# Patient Record
Sex: Female | Born: 1985
Health system: Southern US, Community
[De-identification: ages and names within clinical notes are randomized; demographics above are authoritative.]

## PROBLEM LIST (undated history)

## (undated) DIAGNOSIS — T7840XA Allergy, unspecified, initial encounter: Secondary | ICD-10-CM

## (undated) DIAGNOSIS — Z8669 Personal history of other diseases of the nervous system and sense organs: Secondary | ICD-10-CM

## (undated) DIAGNOSIS — R87629 Unspecified abnormal cytological findings in specimens from vagina: Secondary | ICD-10-CM

## (undated) DIAGNOSIS — E079 Disorder of thyroid, unspecified: Secondary | ICD-10-CM

## (undated) DIAGNOSIS — R896 Abnormal cytological findings in specimens from other organs, systems and tissues: Secondary | ICD-10-CM

## (undated) HISTORY — DX: Allergy, unspecified, initial encounter: T78.40XA

## (undated) HISTORY — DX: Personal history of other diseases of the nervous system and sense organs: Z86.69

## (undated) HISTORY — DX: Disorder of thyroid, unspecified: E07.9

## (undated) HISTORY — PX: WISDOM TOOTH EXTRACTION: SHX21

## (undated) HISTORY — DX: Abnormal cytological findings in specimens from other organs, systems and tissues: R89.6

---

## 2011-10-25 HISTORY — PX: EYE SURGERY: SHX253

## 2011-12-23 DIAGNOSIS — IMO0001 Reserved for inherently not codable concepts without codable children: Secondary | ICD-10-CM

## 2011-12-23 HISTORY — DX: Reserved for inherently not codable concepts without codable children: IMO0001

## 2011-12-29 ENCOUNTER — Encounter (INDEPENDENT_AMBULATORY_CARE_PROVIDER_SITE_OTHER): Payer: BC Managed Care – PPO | Admitting: Registered Nurse

## 2011-12-29 DIAGNOSIS — Z01419 Encounter for gynecological examination (general) (routine) without abnormal findings: Secondary | ICD-10-CM

## 2013-01-01 ENCOUNTER — Ambulatory Visit: Payer: BC Managed Care – PPO | Admitting: Obstetrics and Gynecology

## 2013-01-01 ENCOUNTER — Encounter: Payer: Self-pay | Admitting: Obstetrics and Gynecology

## 2013-01-01 VITALS — BP 118/72 | Temp 98.4°F | Ht 65.0 in | Wt 151.0 lb

## 2013-01-01 DIAGNOSIS — Z124 Encounter for screening for malignant neoplasm of cervix: Secondary | ICD-10-CM

## 2013-01-01 DIAGNOSIS — Z01419 Encounter for gynecological examination (general) (routine) without abnormal findings: Secondary | ICD-10-CM

## 2013-01-01 MED ORDER — NORETHIN ACE-ETH ESTRAD-FE 1-20 MG-MCG PO TABS: 1.0000 | ORAL_TABLET | Freq: Every day | ORAL | Status: DC

## 2013-01-01 NOTE — Progress Notes (Signed)
Subjective:    Heather Richards is a 27 y.o. female, No obstetric history on file., who presents for an annual exam. The patient reports no complaints.  Menstrual cycle:   LMP: Patient's last menstrual period was 12/18/2012.             Review of Systems Pertinent items are noted in HPI. Denies pelvic pain, urinary tract symptoms, vaginitis symptoms, irregular bleeding, menopausal symptoms, change in bowel habits or rectal bleeding   Objective:    BP 118/72  Temp(Src) 98.4 F (36.9 C) (Oral)  Ht 5\' 5"  (1.651 m)  Wt 151 lb (68.493 kg)  BMI 25.13 kg/m2  LMP 12/18/2012   Wt Readings from Last 1 Encounters:  01/01/13 151 lb (68.493 kg)   Body mass index is 25.13 kg/(m^2). General Appearance: Alert, no acute distress HEENT: Grossly normal Neck / Thyroid: Supple, no thyromegaly or cervical adenopathy Lungs: Clear to auscultation bilaterally Back: No CVA tenderness Breast Exam: No masses or nodes.No dimpling, nipple retraction or discharge. Cardiovascular: Regular rate and rhythm.  Gastrointestinal: Soft, non-tender, no masses or organomegaly Pelvic Exam: EGBUS-wnl, vagina-normal rugae, cervix- without lesions or tenderness, uterus appears normal size shape and consistency, adnexae-no masses or tenderness Lymphatic Exam: Non-palpable nodes in neck, clavicular,  axillary, or inguinal regions  Skin: no rashes or abnormalities Extremities: no clubbing cyanosis or edema  Neurologic: grossly normal Psychiatric: Alert and oriented  Assessment:   Routine GYN Exam   Plan:  Junel 1/20  #3  1 po qd  4refills  PAP sent  RTO 1 year or prn  Lynden Flemmer,ELMIRAPA-C

## 2013-01-01 NOTE — Progress Notes (Signed)
Regular Periods: yes Mammogram: no  Monthly Breast Ex.: yes Exercise: no  Tetanus < 10 years: yes Seatbelts: yes  NI. Bladder Functn.: yes Abuse at home: no  Daily BM's: yes Stressful Work: yes  Healthy Diet: yes Sigmoid-Colonoscopy: no  Calcium: no Medical problems this year: none   LAST PAP:3/13  Ascus hpv not detected  Contraception: junel fe1/20  Mammogram:  no  PCP: [facility]  PMH: no change  FMH: no change  Last Bone Scan: no  Pt is married.

## 2013-01-02 LAB — PAP IG W/ RFLX HPV ASCU

## 2013-04-17 ENCOUNTER — Ambulatory Visit (INDEPENDENT_AMBULATORY_CARE_PROVIDER_SITE_OTHER): Payer: BC Managed Care – PPO | Admitting: Emergency Medicine

## 2013-04-17 VITALS — BP 130/85 | HR 85 | Temp 98.3°F | Resp 16 | Ht 64.75 in | Wt 152.4 lb

## 2013-04-17 DIAGNOSIS — N3000 Acute cystitis without hematuria: Secondary | ICD-10-CM

## 2013-04-17 DIAGNOSIS — R109 Unspecified abdominal pain: Secondary | ICD-10-CM

## 2013-04-17 LAB — POCT URINALYSIS DIPSTICK
Bilirubin, UA: NEGATIVE
Blood, UA: NEGATIVE
Glucose, UA: NEGATIVE
Nitrite, UA: NEGATIVE
Urobilinogen, UA: 0.2

## 2013-04-17 LAB — POCT UA - MICROSCOPIC ONLY: Yeast, UA: NEGATIVE

## 2013-04-17 MED ORDER — SULFAMETHOXAZOLE-TRIMETHOPRIM 800-160 MG PO TABS: 1.0000 | ORAL_TABLET | Freq: Two times a day (BID) | ORAL | Status: DC

## 2013-04-17 MED ORDER — HYDROCODONE-ACETAMINOPHEN 5-325 MG PO TABS: 1.0000 | ORAL_TABLET | ORAL | Status: DC | PRN

## 2013-04-17 NOTE — Progress Notes (Signed)
[facility] [address], [place] [address] [phone]  Date:  04/17/2013   Name:  Heather Richards   DOB:  11-19-1985   MRN:  [id number]  PCP:  Purcell Nails, MD    Chief Complaint: Flank Pain   History of Present Illness:  Heather Richards is a 27 y.o. very pleasant female patient who presents with the following:  Ill since Monday with left abdominal pain.  Worse last night.  Awakened her from a sleep with pain.  No dysuria, urgency or frequency.  No nausea or vomiting.  No stool change, vaginal discharge or bleeding.  Pain is colicy in nature and radiates into the left abdomen.  No improvement with over the counter medications or other home remedies. Denies other complaint or health concern today.   There are no active problems to display for this patient.   Past Medical History  Diagnosis Date  . Hx of migraines   . ASCUS (atypical squamous cells of undetermined significance) on Pap smear 3/13    hpv not detected  . Allergy     Past Surgical History  Procedure Laterality Date  . Wisdom tooth extraction      History  Substance Use Topics  . Smoking status: Never Smoker   . Smokeless tobacco: Never Used  . Alcohol Use: Yes     Comment: occasional    Family History  Problem Relation Age of Onset  . Cancer Maternal Aunt     colon  . Cancer Maternal Uncle     brain  . Cancer Paternal Uncle     lung  . Heart disease Maternal Grandfather     No Known Allergies  Medication list has been reviewed and updated.  Current Outpatient Prescriptions on File Prior to Visit  Medication Sig Dispense Refill  . norethindrone-ethinyl estradiol (JUNEL FE 1/20) 1-20 MG-MCG tablet Take 1 tablet by mouth daily.  3 Package  4  . norethindrone-ethinyl estradiol (JUNEL FE,GILDESS FE,LOESTRIN FE) 1-20 MG-MCG tablet Take 1 tablet by mouth daily.       No current facility-administered medications on file prior to visit.    Review of Systems:  As per HPI,  otherwise negative.    Physical Examination: Filed Vitals:   04/17/13 1944  BP: 130/85  Pulse: 85  Temp: 98.3 F (36.8 C)  Resp: 16   Filed Vitals:   04/17/13 1944  Height: 5' 4.75" (1.645 m)  Weight: 152 lb 6.4 oz (69.128 kg)   Body mass index is 25.55 kg/(m^2). Ideal Body Weight: Weight in (lb) to have BMI = 25: 148.8  GEN: WDWN, NAD, Non-toxic, A & O x 3 HEENT: Atraumatic, Normocephalic. Neck supple. No masses, No LAD. Ears and Nose: No external deformity. CV: RRR, No M/G/R. No JVD. No thrill. No extra heart sounds. PULM: CTA B, no wheezes, crackles, rhonchi. No retractions. No resp. distress. No accessory muscle use. ABD: S, NT, ND, +BS. No rebound. No HSM. EXTR: No c/c/e NEURO Normal gait.  PSYCH: Normally interactive. Conversant. Not depressed or anxious appearing.  Calm demeanor.    Assessment and Plan: Cystitis Septra vicodin   Signed,  Phillips Odor, MD   Results for orders placed in visit on 04/17/13  POCT UA - MICROSCOPIC ONLY      Result Value Range   WBC, Ur, HPF, POC 1-5 with small clusters     RBC, urine, microscopic 7-12     Bacteria, U Microscopic 1+  Mucus, UA small     Epithelial cells, urine per micros 1-4     Crystals, Ur, HPF, POC neg     Casts, Ur, LPF, POC neg     Yeast, UA neg    POCT URINALYSIS DIPSTICK      Result Value Range   Color, UA yellow     Clarity, UA hazy     Glucose, UA neg     Bilirubin, UA neg     Ketones, UA neg     Spec Grav, UA 1.010     Blood, UA neg     pH, UA 6.0     Protein, UA neg     Urobilinogen, UA 0.2     Nitrite, UA neg     Leukocytes, UA moderate (2+)

## 2013-04-17 NOTE — Patient Instructions (Addendum)

## 2014-01-09 ENCOUNTER — Ambulatory Visit (INDEPENDENT_AMBULATORY_CARE_PROVIDER_SITE_OTHER): Payer: BC Managed Care – PPO | Admitting: Family Medicine

## 2014-01-09 ENCOUNTER — Ambulatory Visit: Payer: BC Managed Care – PPO

## 2014-01-09 VITALS — BP 121/76 | HR 80 | Temp 98.3°F | Resp 16 | Ht 65.25 in | Wt 147.8 lb

## 2014-01-09 DIAGNOSIS — N390 Urinary tract infection, site not specified: Secondary | ICD-10-CM

## 2014-01-09 DIAGNOSIS — R110 Nausea: Secondary | ICD-10-CM

## 2014-01-09 DIAGNOSIS — R109 Unspecified abdominal pain: Secondary | ICD-10-CM

## 2014-01-09 LAB — POCT CBC
Granulocyte percent: 56.3 % (ref 37–80)
HCT, POC: 37.4 % — AB (ref 37.7–47.9)
Hemoglobin: 11.8 g/dL — AB (ref 12.2–16.2)
Lymph, poc: 3.2 (ref 0.6–3.4)
MCH, POC: 29 pg (ref 27–31.2)
MCHC: 31.6 g/dL — AB (ref 31.8–35.4)
MCV: 91.8 fL (ref 80–97)
MID (cbc): 0.8 (ref 0–0.9)
MPV: 10 fL (ref 0–99.8)
POC Granulocyte: 5.1 (ref 2–6.9)
POC LYMPH PERCENT: 35.2 % (ref 10–50)
POC MID %: 8.5 % (ref 0–12)
Platelet Count, POC: 308 10*3/uL (ref 142–424)
RBC: 4.07 M/uL (ref 4.04–5.48)
RDW, POC: 13.2 %
WBC: 9 10*3/uL (ref 4.6–10.2)

## 2014-01-09 LAB — POCT UA - MICROSCOPIC ONLY
Casts, Ur, LPF, POC: NEGATIVE
Crystals, Ur, HPF, POC: NEGATIVE
Mucus, UA: NEGATIVE
Yeast, UA: NEGATIVE

## 2014-01-09 LAB — POCT URINALYSIS DIPSTICK
Bilirubin, UA: NEGATIVE
Blood, UA: NEGATIVE
Glucose, UA: NEGATIVE
Ketones, UA: NEGATIVE
Nitrite, UA: NEGATIVE
Protein, UA: NEGATIVE
Spec Grav, UA: 1.01
Urobilinogen, UA: 0.2
pH, UA: 7

## 2014-01-09 MED ORDER — SULFAMETHOXAZOLE-TMP DS 800-160 MG PO TABS: 1.0000 | ORAL_TABLET | Freq: Two times a day (BID) | ORAL | Status: DC

## 2014-01-09 NOTE — Progress Notes (Signed)
Chief Complaint:  Chief Complaint  Patient presents with  . Abdominal Pain    2 days, bloating, gas,   . Nausea    HPI: Heather Richards is a 28 y.o. female who is here for 2 day history of bloating and abd cramps after eating pinto beans, she has had loose stools, last BM  Was yesterday. She has had no fevers or chills, however has had 3/10 pain dull on left upper quadrant, bloating and nausea. NO emesis. Nonbloody loose stools. Has not tried anythign for this. Wants to make sure nothing infectious. She has not tried any new meds, no new travesl, they have city water, she has not eaten anything new except beans that her and husband cooked. He does not have any sxs.  She normally does not digest beans well. Prior history of cystitis, treated with bactrim.  Past Medical History  Diagnosis Date  . Hx of migraines   . ASCUS (atypical squamous cells of undetermined significance) on Pap smear 3/13    hpv not detected  . Allergy    Past Surgical History  Procedure Laterality Date  . Wisdom tooth extraction     History   Social History  . Marital Status: Married    Spouse Name: N/A    Number of Children: N/A  . Years of Education: N/A   Social History Main Topics  . Smoking status: Never Smoker   . Smokeless tobacco: Never Used  . Alcohol Use: Yes     Comment: occasional  . Drug Use: No  . Sexual Activity: Yes    Birth Control/ Protection: Pill     Comment: junel1/20   Other Topics Concern  . None   Social History Narrative  . None   Family History  Problem Relation Age of Onset  . Cancer Maternal Aunt     colon  . Cancer Maternal Uncle     brain  . Cancer Paternal Uncle     lung  . Heart disease Maternal Grandfather    No Known Allergies Prior to Admission medications   Medication Sig Start Date End Date Taking? Authorizing Provider  norethindrone-ethinyl estradiol (JUNEL FE,GILDESS FE,LOESTRIN FE) 1-20 MG-MCG tablet Take 1 tablet by mouth daily.   Yes  Historical Provider, MD  Difluprednate (DUREZOL) 0.05 % EMUL Apply to eye.    Historical Provider, MD  gatifloxacin (ZYMAR) 0.3 % ophthalmic drops 1 drop 4 (four) times daily.    Historical Provider, MD  HYDROcodone-acetaminophen (NORCO) 5-325 MG per tablet Take 1-2 tablets by mouth every 4 (four) hours as needed for pain. 04/17/13   Phillips Odor, MD  norethindrone-ethinyl estradiol (JUNEL FE 1/20) 1-20 MG-MCG tablet Take 1 tablet by mouth daily. 01/01/13   Henreitta Leber, PA-C  sulfamethoxazole-trimethoprim (BACTRIM DS,SEPTRA DS) 800-160 MG per tablet Take 1 tablet by mouth 2 (two) times daily. 04/17/13   Phillips Odor, MD     ROS: The patient denies fevers, chills, night sweats, unintentional weight loss, chest pain, palpitations, wheezing, dyspnea on exertion,, vomiting, dysuria, hematuria, melena, numbness, weakness, or tingling.   All other systems have been reviewed and were otherwise negative with the exception of those mentioned in the HPI and as above.    PHYSICAL EXAM: Filed Vitals:   01/09/14 1748  BP: 121/76  Pulse: 80  Temp: 98.3 F (36.8 C)  Resp: 16   Filed Vitals:   01/09/14 1748  Height: 5' 5.25" (1.657 m)  Weight: 147 lb 12.8 oz (67.042 kg)  Body mass index is 24.42 kg/(m^2).  General: Alert, no acute distress HEENT:  Normocephalic, atraumatic, oropharynx patent. EOMI, PERRLA Cardiovascular:  Regular rate and rhythm, no rubs murmurs or gallops.  No Carotid bruits, radial pulse intact. No pedal edema.  Respiratory: Clear to auscultation bilaterally.  No wheezes, rales, or rhonchi.  No cyanosis, no use of accessory musculature GI: No organomegaly, abdomen is soft and non-tender, positive bowel sounds.  No masses. Skin: No rashes. Neurologic: Facial musculature symmetric. Psychiatric: Patient is appropriate throughout our interaction. Lymphatic: No cervical lymphadenopathy Musculoskeletal: Gait intact. No CVA tenderness   LABS: Results for orders placed  in visit on 01/09/14  POCT CBC      Result Value Ref Range   WBC 9.0  4.6 - 10.2 K/uL   Lymph, poc 3.2  0.6 - 3.4   POC LYMPH PERCENT 35.2  10 - 50 %L   MID (cbc) 0.8  0 - 0.9   POC MID % 8.5  0 - 12 %M   POC Granulocyte 5.1  2 - 6.9   Granulocyte percent 56.3  37 - 80 %G   RBC 4.07  4.04 - 5.48 M/uL   Hemoglobin 11.8 (*) 12.2 - 16.2 g/dL   HCT, POC 06.337.4 (*) 01.637.7 - 47.9 %   MCV 91.8  80 - 97 fL   MCH, POC 29.0  27 - 31.2 pg   MCHC 31.6 (*) 31.8 - 35.4 g/dL   RDW, POC 01.013.2     Platelet Count, POC 308  142 - 424 K/uL   MPV 10.0  0 - 99.8 fL  POCT UA - MICROSCOPIC ONLY      Result Value Ref Range   WBC, Ur, HPF, POC 3-5     RBC, urine, microscopic 0-2     Bacteria, U Microscopic trace     Mucus, UA neg     Epithelial cells, urine per micros 1-3     Crystals, Ur, HPF, POC neg     Casts, Ur, LPF, POC neg     Yeast, UA neg    POCT URINALYSIS DIPSTICK      Result Value Ref Range   Color, UA yellow     Clarity, UA clear     Glucose, UA neg     Bilirubin, UA neg     Ketones, UA neg     Spec Grav, UA 1.010     Blood, UA neg     pH, UA 7.0     Protein, UA neg     Urobilinogen, UA 0.2     Nitrite, UA neg     Leukocytes, UA small (1+)       EKG/XRAY:   [facility] Chest is normal Abd is normal   ASSESSMENT/PLAN: Encounter Diagnoses  Name Primary?  . Abdominal pain, other specified site Yes  . Nausea alone   . UTI (urinary tract infection)    Rx Bactrim DS BID x 7 days for presumptive treatment possibel UTI I think she may just have some food intolerance to beans and sxs are stemming from this.However sicne she has had UTIs/cystitis in the past will go ahead and treat.  Push fluids Urine cx pending F/u prn   Gross sideeffects, risk and benefits, and alternatives of medications d/w patient. Patient is aware that all medications have potential sideeffects and we are unable to predict every sideeffect or drug-drug interaction that may  occur.  Hamilton CapriLE, Joslin Doell PHUONG, DO 01/09/2014 7:18  PM

## 2014-01-09 NOTE — Patient Instructions (Signed)
Urinary Tract Infection  Urinary tract infections (UTIs) can develop anywhere along your urinary tract. Your urinary tract is your body's drainage system for removing wastes and extra water. Your urinary tract includes two kidneys, two ureters, a bladder, and a urethra. Your kidneys are a pair of bean-shaped organs. Each kidney is about the size of your fist. They are located below your ribs, one on each side of your spine.  CAUSES  Infections are caused by microbes, which are microscopic organisms, including fungi, viruses, and bacteria. These organisms are so small that they can only be seen through a microscope. Bacteria are the microbes that most commonly cause UTIs.  SYMPTOMS   Symptoms of UTIs may vary by age and gender of the patient and by the location of the infection. Symptoms in young women typically include a frequent and intense urge to urinate and a painful, burning feeling in the bladder or urethra during urination. Older women and men are more likely to be tired, shaky, and weak and have muscle aches and abdominal pain. A fever may mean the infection is in your kidneys. Other symptoms of a kidney infection include pain in your back or sides below the ribs, nausea, and vomiting.  DIAGNOSIS  To diagnose a UTI, your caregiver will ask you about your symptoms. Your caregiver also will ask to provide a urine sample. The urine sample will be tested for bacteria and white blood cells. White blood cells are made by your body to help fight infection.  TREATMENT   Typically, UTIs can be treated with medication. Because most UTIs are caused by a bacterial infection, they usually can be treated with the use of antibiotics. The choice of antibiotic and length of treatment depend on your symptoms and the type of bacteria causing your infection.  HOME CARE INSTRUCTIONS   If you were prescribed antibiotics, take them exactly as your caregiver instructs you. Finish the medication even if you feel better after you  have only taken some of the medication.   Drink enough water and fluids to keep your urine clear or pale yellow.   Avoid caffeine, tea, and carbonated beverages. They tend to irritate your bladder.   Empty your bladder often. Avoid holding urine for long periods of time.   Empty your bladder before and after sexual intercourse.   After a bowel movement, women should cleanse from front to back. Use each tissue only once.  SEEK MEDICAL CARE IF:    You have back pain.   You develop a fever.   Your symptoms do not begin to resolve within 3 days.  SEEK IMMEDIATE MEDICAL CARE IF:    You have severe back pain or lower abdominal pain.   You develop chills.   You have nausea or vomiting.   You have continued burning or discomfort with urination.  MAKE SURE YOU:    Understand these instructions.   Will watch your condition.   Will get help right away if you are not doing well or get worse.  Document Released: 07/20/2005 Document Revised: 04/10/2012 Document Reviewed: 11/18/2011  Exit[employer] Patient Information 2014 [employer].

## 2014-01-10 LAB — COMPREHENSIVE METABOLIC PANEL WITH GFR
ALT: 18 U/L (ref 0–35)
AST: 15 U/L (ref 0–37)
Calcium: 9.2 mg/dL (ref 8.4–10.5)
Chloride: 104 meq/L (ref 96–112)
Creat: 0.71 mg/dL (ref 0.50–1.10)
Potassium: 4.6 meq/L (ref 3.5–5.3)
Sodium: 139 meq/L (ref 135–145)

## 2014-01-10 LAB — COMPREHENSIVE METABOLIC PANEL
Albumin: 4.5 g/dL (ref 3.5–5.2)
Alkaline Phosphatase: 54 U/L (ref 39–117)
BUN: 8 mg/dL (ref 6–23)
CO2: 27 mEq/L (ref 19–32)
Glucose, Bld: 83 mg/dL (ref 70–99)
Total Bilirubin: 0.3 mg/dL (ref 0.2–1.2)
Total Protein: 6.8 g/dL (ref 6.0–8.3)

## 2014-01-11 LAB — URINE CULTURE: Colony Count: 45000

## 2014-01-15 ENCOUNTER — Telehealth: Payer: Self-pay | Admitting: Family Medicine

## 2014-01-15 NOTE — Telephone Encounter (Signed)
LM to call me back when I get into office. She still has similar issues to prior. May need OV if persistent, last time we only did UA and treated as UTI , if she still has pain then will ned STD testing and pelvic exam.

## 2015-10-25 DIAGNOSIS — D649 Anemia, unspecified: Secondary | ICD-10-CM

## 2015-10-25 DIAGNOSIS — E039 Hypothyroidism, unspecified: Secondary | ICD-10-CM

## 2015-10-25 HISTORY — DX: Hypothyroidism, unspecified: E03.9

## 2015-10-25 HISTORY — DX: Anemia, unspecified: D64.9

## 2015-10-25 NOTE — L&D Delivery Note (Signed)
Vaginal Delivery Note The pt utilized an epidural as pain management.   Spontaneous rupture of membranes yesterday, at 2109, clear.  GBS was positive, pt refused treatment.     Pushing with guidance began at  0730.   After 35 minutes of pushing the head, shoulders and the body of a viable female infant "Marcello Mooressaac" delivered spontaneously with maternal effort in the ROA position at 0805.    Loose Freedom x 1, unable to reduced, infant somersaulted thru without difficulty.   With vigorous tone and spontaneous cry, the infant was placed on moms abd.  After the umbilical cord was clamped it was cut by the FOB, then cord blood was obtained for evaluation. Spontaneous delivery of a intact placenta with a 3 vessel cord via Shultz at  [phone].   Episiotomy: None   The vulva, perineum, vaginal vault, rectum and cervix were inspected and revealed a 2 degree deep vaginal & perineal, repaired using a 3-0 vicryl on a CT needle and a superficial bilateral labial  repaired using a 4-0 vicryl on a SH needle with 40cc of 1% lidocaine.  Lidocaine was not used, the epidural was sufficient for the repair.   The rectum sphincter intact after the repair.   Patient tolerated repair well.   Postpartum pitocin as ordered.  Fundus firm, lochia minimum, bleeding under control.   EBL 100, Pt hemodynamically stable.   Sponge, laps and needle count correct and verified with the primary care nurse.  Attending MD available at all times.    Routine postpartum orders   Mother desires paragaud for contraception  Mom plans to breastfeed   Infant will not becircumcision   Placenta to pathology: NO     Cord Gases sent to lab: NO Cord blood sent to lab: YES   APGARS:  9 at 1 minute and 9 at 5 minutes Weight:. pending     Both mom and baby were left in stable condition, baby skin to skin.      Heather Richards, CNM, MSN 01/21/2016. 12:56 PM

## 2016-01-19 ENCOUNTER — Encounter (HOSPITAL_COMMUNITY): Payer: Self-pay | Admitting: *Deleted

## 2016-01-19 ENCOUNTER — Inpatient Hospital Stay (HOSPITAL_COMMUNITY)
Admission: AD | Admit: 2016-01-19 | Discharge: 2016-01-23 | DRG: 775 | Disposition: A | Discharge status: Home / Self Care | Payer: BLUE CROSS/BLUE SHIELD | Source: Ambulatory Visit | Attending: Obstetrics and Gynecology | Admitting: Obstetrics and Gynecology

## 2016-01-19 DIAGNOSIS — O4202 Full-term premature rupture of membranes, onset of labor within 24 hours of rupture: Principal | ICD-10-CM | POA: Diagnosis present

## 2016-01-19 DIAGNOSIS — O9989 Other specified diseases and conditions complicating pregnancy, childbirth and the puerperium: Secondary | ICD-10-CM

## 2016-01-19 DIAGNOSIS — D62 Acute posthemorrhagic anemia: Secondary | ICD-10-CM | POA: Diagnosis not present

## 2016-01-19 DIAGNOSIS — E669 Obesity, unspecified: Secondary | ICD-10-CM | POA: Diagnosis present

## 2016-01-19 DIAGNOSIS — O26893 Other specified pregnancy related conditions, third trimester: Secondary | ICD-10-CM | POA: Diagnosis present

## 2016-01-19 DIAGNOSIS — Z6791 Unspecified blood type, Rh negative: Secondary | ICD-10-CM | POA: Diagnosis not present

## 2016-01-19 DIAGNOSIS — Z6831 Body mass index (BMI) 31.0-31.9, adult: Secondary | ICD-10-CM | POA: Diagnosis not present

## 2016-01-19 DIAGNOSIS — Z283 Underimmunization status: Secondary | ICD-10-CM

## 2016-01-19 DIAGNOSIS — O99824 Streptococcus B carrier state complicating childbirth: Secondary | ICD-10-CM | POA: Diagnosis present

## 2016-01-19 DIAGNOSIS — O99214 Obesity complicating childbirth: Secondary | ICD-10-CM | POA: Diagnosis present

## 2016-01-19 DIAGNOSIS — Z2839 Other underimmunization status: Secondary | ICD-10-CM

## 2016-01-19 DIAGNOSIS — O9081 Anemia of the puerperium: Secondary | ICD-10-CM | POA: Diagnosis not present

## 2016-01-19 DIAGNOSIS — Z3A41 41 weeks gestation of pregnancy: Secondary | ICD-10-CM | POA: Diagnosis not present

## 2016-01-19 DIAGNOSIS — O26899 Other specified pregnancy related conditions, unspecified trimester: Secondary | ICD-10-CM | POA: Diagnosis present

## 2016-01-19 DIAGNOSIS — B951 Streptococcus, group B, as the cause of diseases classified elsewhere: Secondary | ICD-10-CM | POA: Diagnosis present

## 2016-01-19 DIAGNOSIS — O480 Post-term pregnancy: Secondary | ICD-10-CM | POA: Diagnosis present

## 2016-01-19 DIAGNOSIS — O09899 Supervision of other high risk pregnancies, unspecified trimester: Secondary | ICD-10-CM

## 2016-01-19 HISTORY — DX: Other specified pregnancy related conditions, unspecified trimester: O26.899

## 2016-01-19 HISTORY — DX: Unspecified blood type, rh negative: Z67.91

## 2016-01-19 HISTORY — DX: Unspecified abnormal cytological findings in specimens from vagina: R87.629

## 2016-01-19 HISTORY — DX: Other underimmunization status: Z28.39

## 2016-01-19 LAB — CBC
HEMATOCRIT: 37.4 % (ref 36.0–46.0)
Hemoglobin: 12.8 g/dL (ref 12.0–15.0)
MCH: 29.4 pg (ref 26.0–34.0)
MCHC: 34.2 g/dL (ref 30.0–36.0)
MCV: 85.8 fL (ref 78.0–100.0)
Platelets: 198 10*3/uL (ref 150–400)
RBC: 4.36 MIL/uL (ref 3.87–5.11)
RDW: 13.8 % (ref 11.5–15.5)
WBC: 15.7 10*3/uL — ABNORMAL HIGH (ref 4.0–10.5)

## 2016-01-19 LAB — OB RESULTS CONSOLE RPR: RPR: NONREACTIVE

## 2016-01-19 LAB — OB RESULTS CONSOLE ABO/RH: RH Type: NEGATIVE

## 2016-01-19 LAB — OB RESULTS CONSOLE GC/CHLAMYDIA
CHLAMYDIA, DNA PROBE: NEGATIVE
Gonorrhea: NEGATIVE

## 2016-01-19 LAB — TYPE AND SCREEN
ABO/RH(D): O NEG
ANTIBODY SCREEN: NEGATIVE

## 2016-01-19 LAB — OB RESULTS CONSOLE GBS: GBS: POSITIVE

## 2016-01-19 LAB — OB RESULTS CONSOLE ANTIBODY SCREEN: Antibody Screen: NEGATIVE

## 2016-01-19 LAB — OB RESULTS CONSOLE HIV ANTIBODY (ROUTINE TESTING): HIV: NONREACTIVE

## 2016-01-19 LAB — OB RESULTS CONSOLE RUBELLA ANTIBODY, IGM: RUBELLA: NON-IMMUNE/NOT IMMUNE

## 2016-01-19 LAB — OB RESULTS CONSOLE HEPATITIS B SURFACE ANTIGEN: Hepatitis B Surface Ag: NEGATIVE

## 2016-01-19 LAB — ABO/RH: ABO/RH(D): O NEG

## 2016-01-19 MED ORDER — ONDANSETRON HCL 4 MG/2ML IJ SOLN: 4.0000 mg | Freq: Four times a day (QID) | INTRAMUSCULAR | Status: DC | PRN

## 2016-01-19 MED ORDER — CITRIC ACID-SODIUM CITRATE 334-500 MG/5ML PO SOLN: 30.0000 mL | ORAL | Status: DC | PRN

## 2016-01-19 MED ORDER — LACTATED RINGERS IV SOLN: 500.0000 mL | INTRAVENOUS | Status: DC | PRN

## 2016-01-19 MED ORDER — LACTATED RINGERS IV SOLN
INTRAVENOUS | Status: DC
  Administered 2016-01-20: 22:00:00 via INTRAVENOUS

## 2016-01-19 MED ORDER — ACETAMINOPHEN 325 MG PO TABS
650.0000 mg | ORAL_TABLET | ORAL | Status: DC | PRN
  Administered 2016-01-21: 650 mg via ORAL
  Filled 2016-01-19: qty 2

## 2016-01-19 MED ORDER — OXYCODONE-ACETAMINOPHEN 5-325 MG PO TABS
2.0000 | ORAL_TABLET | ORAL | Status: DC | PRN
Start: 2016-01-19 — End: 2016-01-21

## 2016-01-19 MED ORDER — OXYTOCIN BOLUS FROM INFUSION
500.0000 mL | INTRAVENOUS | Status: DC
  Administered 2016-01-21: 500 mL via INTRAVENOUS

## 2016-01-19 MED ORDER — LIDOCAINE HCL (PF) 1 % IJ SOLN
30.0000 mL | INTRAMUSCULAR | Status: AC | PRN
  Administered 2016-01-21 (×2): 30 mL via SUBCUTANEOUS
  Filled 2016-01-19 (×2): qty 30

## 2016-01-19 MED ORDER — FLEET ENEMA 7-19 GM/118ML RE ENEM: 1.0000 | ENEMA | RECTAL | Status: DC | PRN

## 2016-01-19 MED ORDER — FENTANYL CITRATE (PF) 100 MCG/2ML IJ SOLN
100.0000 ug | INTRAMUSCULAR | Status: DC | PRN
  Administered 2016-01-20: 100 ug via INTRAVENOUS
  Filled 2016-01-19: qty 2

## 2016-01-19 MED ORDER — OXYTOCIN 10 UNIT/ML IJ SOLN
2.5000 [IU]/h | INTRAVENOUS | Status: DC
  Filled 2016-01-19: qty 10

## 2016-01-19 MED ORDER — OXYCODONE-ACETAMINOPHEN 5-325 MG PO TABS: 1.0000 | ORAL_TABLET | ORAL | Status: DC | PRN

## 2016-01-19 NOTE — H&P (Signed)
Heather Richards is a 30 y.o. female, G1P0 at 39 2/7 weeks, presenting for UCs of increasing intensity and frequency since early am, now q 3 min.  Denies leaking or bleeding, reports +FM.  Patient has declined induction for post-dates.  No recent cervical exam.  Patient Active Problem List   Diagnosis Date Noted  . Normal labor 01/19/2016  . Positive GBS test--declines treatment 01/19/2016  . Rubella non-immune status, antepartum 01/19/2016  . Rh negative status during pregnancy 01/19/2016    History of present pregnancy: Patient entered care at 6 2/7 weeks for interview, and 10 2/7 weeks for NOB w/u.   EDC of 01/10/16 was established by LMP.   Anatomy scan:  19 3/7 weeks, with normal findings.   Additional [place] evaluations:  None Significant prenatal events:  +GBS on NOB urine, but declined treatment then, and continued to decline treatment planned during labor--several conversations during prenatal visits regarding this issue, and patient was consistent in her declining of treatment.  She planned non-medicated birth and desired use of WB tub.  She attended class and signed consents.  Attended [name] preparation.  She declined induction at 41 weeks, and planned to decline induction even after 42 weeks, despite risk of fetal death.  Last evaluation:  01/20/2016--BP 120/70, no cervical exam, weight 191.  Still declined induction and GBS prophylaxis.  OB History    Gravida Para Term Preterm AB TAB SAB Ectopic Multiple Living   1         0     Past Medical History  Diagnosis Date  . Hx of migraines   . ASCUS (atypical squamous cells of undetermined significance) on Pap smear 3/13    hpv not detected  . Allergy   . Vaginal Pap smear, abnormal    Past Surgical History  Procedure Laterality Date  . Wisdom tooth extraction     Family History: family history includes Cancer in her maternal aunt, maternal uncle, and paternal uncle; Heart disease in her maternal grandfather.   Social History:   reports that she has never smoked. She has never used smokeless tobacco. She reports that she drinks alcohol. She reports that she does not use illicit drugs.  Patient in Caucasian, of the [place] faith, college educated, married to [name], who is involved and supportive.  Patient is an enrollment specialist.   Prenatal Transfer Tool  Maternal Diabetes: No Genetic Screening: Declined Maternal Ultrasounds/Referrals: Normal Fetal Ultrasounds or other Referrals:  None Maternal Substance Abuse:  No Significant Maternal Medications:  None Significant Maternal Lab Results: Lab values include: Group B Strep positive, Rh negative  TDAP Declined Flu during pregnancy  ROS:  UCs, +FM  No Known Allergies  Chest clear Heart RRR without murmur Abd gravid, NT, FH 40 cm, EFW 8+ lbs Pelvic: posterior, 1 cm, 90%, vtx, -1, BBOW Ext: WNL  FHR: Category 1 on initial NST UCs:  q 3 min, moderate  Prenatal labs: ABO, Rh: --/--/O NEG, O NEG (03/28 1822) Antibody: NEG (03/28 1822) Rubella:  Non-immune RPR: Nonreactive (03/28 0000)  HBsAg: Negative (03/28 0000)  HIV: Non-reactive (03/28 0000)  GBS: Positive (03/28 0000) Sickle cell/Hgb electrophoresis:  NA Pap:  2015, WNL GC:  Negative 05/18/16 Chlamydia:  Negative 05/18/16 Genetic screenings:  Declined Glucola:  WNL Other:   Hgb 13.2 at NOB, 12.2 at 28 weeks       Assessment/Plan: IUP at 41 2/7 weeks Early labor GBS positive--declines treatment Rubella non-immune Rh negative Desires waterbirth  Plan: Admit to [name]  Suite per consult with Dr. [name] Routine [name] orders Desires use of waterbirth tub. Pain med/epidural prn--patient currently declines all pain med Again declines GBS prophylaxis--reviewed with patient the potential for GBS infection, with potential for devastating sequellae and even death of infant.  Patient and husband still decline prophylaxis. They decline Vit K (plan to use oral med), erythromycin eye ointment,  Hep B vaccine, and circumcision if female infant. Will continue to observe for progression of labor. OK for tub use as labor advances.   [name], [name], MN 01/19/2016, 11:02 PM

## 2016-01-19 NOTE — Progress Notes (Signed)
Heather NashKatie C Richards MRN: [id number]  Subjective: -Care assumed of 29y.o. G1P0 at 41.2wks who presents for early labor.  Patient is GBS positive, but is refusing prophylaxis.  Patient desires WB tub for labor and delivery mgmt.   Objective: BP 130/80 mmHg  Pulse 108  Temp(Src) 97.4 F (36.3 C) (Axillary)  Resp 18  Ht 5\' 5"  (1.651 m)  Wt 86.637 kg (191 lb)  BMI 31.78 kg/m2      Fetal Monitoring: FHT: 135 bpm, Mod Var, -Decels, +Accels UC: Q2-794min, palpates mild    Vaginal Exam: SVE: Deferred Membranes: Intact Internal Monitors: None  Augmentation/Induction: Pitocin:None Cytotec: None  Assessment:  IUP at 41.2wks Cat I FT  Early Labor Desires WB Expectant Mgmt GBS Positive Rh Negative  Plan: -Expectant mgmt at current -NST Q hr, but otherwise intermittent monitoring -WB tub as labor advances -Will perform VE at pt's discretion -Patient declines GBS prophylaxis -Continue other mgmt as ordered -Dr. ND updated on patient status and advises: +VE Q4-6 hrs to allow for proper documentation of progress or lack thereof -Will discuss with patient    Valma CavaJessica L Haylin Camilli,MSN, CNM 01/19/2016, 8:03 PM

## 2016-01-19 NOTE — Progress Notes (Signed)
Garner NashKatie C Anker MRN: [id number]  Subjective: -Patient requests VE.  In room to assess.  Reports that she is "kinda" coping well with contractions, but admits that they are getting stronger.    Objective: BP 137/79 mmHg  Pulse 112  Temp(Src) 97.4 F (36.3 C) (Axillary)  Resp 16  Ht 5\' 5"  (1.651 m)  Wt 86.637 kg (191 lb)  BMI 31.78 kg/m2      Fetal Monitoring: FHT: 135 bpm, Mod Var, + Variable Decel x 1, +Accels UC: palpates mild to moderate    Vaginal Exam: SVE:   Dilation: 3 Effacement (%): 90 Station: -1 Exam by:: J.Jazira Maloney, CNM Membranes:Intact Internal Monitors: None  Augmentation/Induction: Pitocin:None Cytotec: None  Assessment:  IUP at 41.2wks Cat I FT Overall Early Labor GBS Positive  Plan: -Position change with resolution of variable deceleration -Will monitor for 20 min from variable for reactive NST -Will recheck in 4-6hrs or as desired -Continue other mgmt as ordered  Valma CavaJessica L Shan Padgett,MSN, CNM 01/19/2016, 10:07 PM

## 2016-01-20 LAB — HIV ANTIBODY (ROUTINE TESTING W REFLEX): HIV Screen 4th Generation wRfx: NONREACTIVE

## 2016-01-20 LAB — RPR: RPR: NONREACTIVE

## 2016-01-20 MED ORDER — PHENYLEPHRINE 40 MCG/ML (10ML) SYRINGE FOR IV PUSH (FOR BLOOD PRESSURE SUPPORT)
80.0000 ug | PREFILLED_SYRINGE | INTRAVENOUS | Status: DC | PRN
  Filled 2016-01-20: qty 20
  Filled 2016-01-20: qty 2

## 2016-01-20 MED ORDER — DIPHENHYDRAMINE HCL 50 MG/ML IJ SOLN: 12.5000 mg | INTRAMUSCULAR | Status: DC | PRN

## 2016-01-20 MED ORDER — EPHEDRINE 5 MG/ML INJ
10.0000 mg | INTRAVENOUS | Status: DC | PRN
  Filled 2016-01-20: qty 2

## 2016-01-20 MED ORDER — FENTANYL 2.5 MCG/ML BUPIVACAINE 1/10 % EPIDURAL INFUSION (WH - ANES)
14.0000 mL/h | INTRAMUSCULAR | Status: DC | PRN
  Administered 2016-01-21 (×2): 14 mL/h via EPIDURAL
  Filled 2016-01-20 (×2): qty 125

## 2016-01-20 MED ORDER — LACTATED RINGERS IV SOLN: 500.0000 mL | Freq: Once | INTRAVENOUS | Status: DC

## 2016-01-20 MED ORDER — PHENYLEPHRINE 40 MCG/ML (10ML) SYRINGE FOR IV PUSH (FOR BLOOD PRESSURE SUPPORT)
80.0000 ug | PREFILLED_SYRINGE | INTRAVENOUS | Status: DC | PRN
  Filled 2016-01-20: qty 2

## 2016-01-20 NOTE — Progress Notes (Signed)
Pt deciding to get in birthing tub at this time.

## 2016-01-20 NOTE — Progress Notes (Signed)
Heather Richards MRN: [id number]  Subjective: -Care Assumed.  Patient in waterbirth tub and reports despite pain she is coping well.  Patient requests AROM to help with labor progress.   Objective: BP 148/79 mmHg  Pulse 99  Temp(Src) 97.9 F (36.6 C) (Oral)  Resp 18  Ht 5\' 5"  (1.651 m)  Wt 86.637 kg (191 lb)  BMI 31.78 kg/m2  SpO2 97%      Fetal Monitoring: FHT: 135 by doppler UC: Palpates strong    Vaginal Exam: SVE:   Dilation: 10 Effacement (%): 100 Station: 0 Exam by:: Henderson NewcomerStephanie faulk, RN Membranes:Intact Internal Monitors: None  Augmentation/Induction: Pitocin:None Cytotec: None  Assessment:  IUP at 41.3wks 2nd Stage Labor GBS Positive-Declines Tx Reassuring FHT  Plan: -Discussed lack of GBS prophylaxis and desire for patient to remain intact for as long as possible to reduce incident of fetal exposure to bacteria -Patient verbalizes understanding and request exam to evaluate fetal station -Fetal Station: 0 with BBOW at +2 -Patient informed of results and encouraged to push which would result in SROM -Continue other mgmt as ordered -Dr. Dion BodyVarnado updated on patient status and advised: *Evaluating patient again  *Discussing change in plan with possible interventions if pushing lasts longer than 3 hours with no significant change -Will discuss at next assessment  [name],MSN, CNM 01/20/2016, 7:54 PM

## 2016-01-20 NOTE — Progress Notes (Signed)
Pt in the labor tub

## 2016-01-20 NOTE — Progress Notes (Signed)
Heather NashKatie C Richards MRN: [id number]  Subjective: -Patient breathing well through contractions. Reports "they hurt."  States she was in the tub for 1 hour with a decrease in contractions, but have increased since getting out.  Patient reports increased vaginal pressure resulting in urination during contractions.  However, patient declines VE at current, but questions when she should get the next one.   Objective: BP 144/64 mmHg  Pulse 123  Temp(Src) 98.1 F (36.7 C) (Oral)  Resp 18  Ht 5\' 5"  (1.651 m)  Wt 86.637 kg (191 lb)  BMI 31.78 kg/m2      Fetal Monitoring: FHT: 135 by doppler  UC: palpates strong    Vaginal Exam: SVE: Deferred Membranes:Intact Internal Monitors: None  Augmentation/Induction: Pitocin:None Cytotec: None  Assessment:  IUP at 41.3wks Reassuring FHT  Early Labor  Plan: -Instructed to obtain next VE within 4-6 hrs from previous; 0700-0900 -Encouragement given regarding labor process -Continue other mgmt as ordered -Dr. ND to be updated on patient status -Report to be given to oncoming provider, Heather Richards,CNM   Heather CavaJessica Richards Heather Mankowski,MSN, CNM 01/20/2016, 6:37 AM

## 2016-01-20 NOTE — Progress Notes (Signed)
Labor Progress  Subjective: C/o increase pain, nitrous helping a little  Objective: BP 148/79 mmHg  Pulse 99  Temp(Src) 98.4 F (36.9 C) (Oral)  Resp 20  Ht 5\' 5"  (1.651 m)  Wt 191 lb (86.637 kg)  BMI 31.78 kg/m2  SpO2 97%     FHT: 126 via doppler CTX:  regular, every 3-4 minutes Uterus gravid, soft non tender SVE:  7-8/90-0   Assessment:  IUP at 41.3 weeks Membranes:  BBW,   Induction: n/a  Pain management:  Nitrous:   GBS positive, refusing abx  Plan: Continue labor plan No AROM d/t GBS intermittent monitoring Rest/Ambulate Frequent position changes to facilitate fetal rotation and descent. Will reassess with cervical exam at 1600or earlier if necessary      Heather Richards, CNM, MSN 01/20/2016. 4:15 PM

## 2016-01-20 NOTE — Progress Notes (Signed)
Labor Progress  Subjective: Pt struggling thru ctx.  Desires a water birth.  Pt is GBS + but is refusing abx unless she get a fever.    Objective: BP 151/74 mmHg  Pulse 116  Temp(Src) 98.5 F (36.9 C) (Oral)  Resp 20  Ht 5\' 5"  (1.651 m)  Wt 191 lb (86.637 kg)  BMI 31.78 kg/m2  SpO2 97%     FHT: 125, moderate variability + accel, no decel CTX:  regular, every 2-3 minutes Uterus gravid, soft non tender SVE:  Dilation: 4 Effacement (%): 90 Station: -1 Exam by:: Renny Gunnarson, CNM   Assessment:  IUP at 41.3 weeks NICHD: Category 1 Membranes: BBW  Induction: N/A  Pain management: decline  GBS positive  ZOX:WRUEAVWAbx:refused  Plan: Continue labor plan NST then intermittent monitoring Rest Ambulate Frequent position changes to facilitate fetal rotation and descent. Will reassess with cervical exam at 1200 or earlier if necessary       Ethridge Sollenberger, CNM, MSN 01/20/2016. 10:09 AM

## 2016-01-20 NOTE — Progress Notes (Signed)
Heather Richards MRN: [id number]  Subjective: -In room to assess.  Patient reports increased pain and pressure with contractions.  Husband remains at bedside, supportive.   Objective: BP 134/72 mmHg  Pulse 111  Temp(Src) 97.1 F (36.2 C) (Oral)  Resp 20  Ht 5\' 5"  (1.651 m)  Wt 86.637 kg (191 lb)  BMI 31.78 kg/m2      Fetal Monitoring: FHT: 135 bpm, Mod Var, -Decels, +Accels UC: Q2-235min, palpates moderate    Vaginal Exam: SVE:   Dilation: 3.5 Effacement (%): 90 Station: -1 Exam by:: Heather Richards, CNM Membranes:Intact Internal Monitors: None  Augmentation/Induction: Pitocin:None Cytotec: None  Assessment:  IUP at 41.3wks Cat I FT  Early Labor  Plan: -Patient does not desire to discuss VE -Encouraged to get in WB tub to promote comfort -Continue other mgmt as ordered -Dr. ND updated on patient status  Heather CavaJessica L Cherelle Midkiff,MSN, CNM 01/20/2016, 4:31 AM

## 2016-01-20 NOTE — Progress Notes (Signed)
Garner NashKatie C Neuroth MRN: [id number]  Subjective: -Patient reports SROM at 2109.  Reports contractions have increased in intensity since SROM. Agreeable to cervical exam outside of birthing tub.    Objective: BP 142/69 mmHg  Pulse 101  Temp(Src) 97.9 F (36.6 C) (Oral)  Resp 18  Ht 5\' 5"  (1.651 m)  Wt 86.637 kg (191 lb)  BMI 31.78 kg/m2  SpO2 98%      Fetal Monitoring: FHT: 135 bpm, Mod Var, -Decels, +Accels UC:Q2-273min palpates strong    Vaginal Exam: SVE:   Dilation: 8 Effacement (%): 80 (Anterior) Station: 0 Exam by:: Gerrit HeckJessica Mariana Goytia, CNM Membranes:SROM at 2109, Light MSF Noted Internal Monitors: None  Augmentation/Induction: Pitocin:None Cytotec: None  Assessment:  IUP at 41.3wks Cat I FT  SROM GBS Positive MSAF Progressive Labor  Plan: -VE discussed-patient verbalizes disappointment regarding significant change in exam.  Explained that BBOW was obscuring true cervical dilation and once ruptured cervix now dependent on pressure of fetal head. -Informed of MSAF and lack of GBS treatment as means of fetal distress and increased risk of maternal and fetal infection -Extensive discussion regarding plan for continued labor progress; including change in pain mgmt and initiation of augmentation. -Encouraged to consider all options and discussion as above and update provider  -Continue other mgmt as ordered -Dr. Enid BaasEV updated on patient status  Valma CavaJessica L Exie Chrismer,MSN, CNM 01/20/2016, 10:09 PM

## 2016-01-20 NOTE — Progress Notes (Signed)
Labor Progress  Subjective: Very difficult working thru each ctx.  Discussion on GBS and abx.  Pt and FOB said "abx are over used and the research said there no benefit unless a clear infection is present".  "I t hink my water broke or I pee on myself"  Objective: BP 148/79 mmHg  Pulse 99  Temp(Src) 98.4 F (36.9 C) (Oral)  Resp 20  Ht 5\' 5"  (1.651 m)  Wt 191 lb (86.637 kg)  BMI 31.78 kg/m2  SpO2 97%     FHT: 133 CTX:  regular, every 2-4 minutes Uterus gravid, soft non tender SVE:  Dilation: 8.5 Effacement (%): 90 Station: 0 Exam by:: V. Mohsen Odenthal   Assessment:  IUP at 41.3 weeks Membranes:  BBW  Induction: n/a  Pain management:  Nitrous:   GBS positive refused abx  Plan: Continue labor plan intermittent monitoring Rest/Ambulate Frequent position changes to facilitate fetal rotation and descent. Will reassess with cervical exam at 1800 or earlier if necessary ok to get back in the tub     Seraj Dunnam, CNM, MSN 01/20/2016. 4:19 PM

## 2016-01-20 NOTE — Progress Notes (Signed)
Labor Progress  Subjective: Working thru [employer] ctx.  FOB at the bedside very supportive  Objective: BP 133/72 mmHg  Pulse 108  Temp(Src) 98.4 F (36.9 C) (Oral)  Resp 20  Ht 5\' 5"  (1.651 m)  Wt 191 lb (86.637 kg)  BMI 31.78 kg/m2  SpO2 97%     FHT: 125, moderate variability, + acel, no decel CTX:  regular, every 3-4 minutes Uterus gravid, soft non tender SVE:  6/90/-1   Assessment:  IUP at 41.3 weeks NICHD: Category 1 Membranes:  intact  Induction: n/a  Pain management: nitrous  GBS positive, refused abx   Plan: Continue labor plan intermittent monitoring Frequent position changes to facilitate fetal rotation and descent. Will reassess with cervical exam at 1400 or earlier if necessary      Jsiah Menta, CNM, MSN 01/20/2016. 4:11 PM

## 2016-01-21 ENCOUNTER — Inpatient Hospital Stay (HOSPITAL_COMMUNITY): Payer: BLUE CROSS/BLUE SHIELD | Admitting: Anesthesiology

## 2016-01-21 ENCOUNTER — Encounter (HOSPITAL_COMMUNITY): Payer: Self-pay

## 2016-01-21 LAB — PROTEIN / CREATININE RATIO, URINE
Creatinine, Urine: 109 mg/dL
PROTEIN CREATININE RATIO: 0.06 mg/mg{creat} (ref 0.00–0.15)
Total Protein, Urine: 7 mg/dL

## 2016-01-21 MED ORDER — ONDANSETRON HCL 4 MG PO TABS
4.0000 mg | ORAL_TABLET | ORAL | Status: DC | PRN
  Administered 2016-01-21: 4 mg via ORAL
  Filled 2016-01-21: qty 1

## 2016-01-21 MED ORDER — LIDOCAINE HCL (PF) 1 % IJ SOLN
INTRAMUSCULAR | Status: DC | PRN
  Administered 2016-01-21: 2 mL via EPIDURAL
  Administered 2016-01-21: 3 mL via EPIDURAL
  Administered 2016-01-21: 5 mL via EPIDURAL

## 2016-01-21 MED ORDER — OXYCODONE HCL 5 MG PO TABS: 5.0000 mg | ORAL_TABLET | ORAL | Status: DC | PRN

## 2016-01-21 MED ORDER — PRENATAL MULTIVITAMIN CH
1.0000 | ORAL_TABLET | Freq: Every day | ORAL | Status: DC
  Administered 2016-01-21 – 2016-01-22 (×2): 1 via ORAL
  Filled 2016-01-21 (×2): qty 1

## 2016-01-21 MED ORDER — SENNOSIDES-DOCUSATE SODIUM 8.6-50 MG PO TABS
2.0000 | ORAL_TABLET | ORAL | Status: DC
  Administered 2016-01-22 (×2): 2 via ORAL
  Filled 2016-01-21 (×2): qty 2

## 2016-01-21 MED ORDER — TETANUS-DIPHTH-ACELL PERTUSSIS 5-2.5-18.5 LF-MCG/0.5 IM SUSP: 0.5000 mL | Freq: Once | INTRAMUSCULAR | Status: DC

## 2016-01-21 MED ORDER — IBUPROFEN 600 MG PO TABS
600.0000 mg | ORAL_TABLET | Freq: Four times a day (QID) | ORAL | Status: DC
  Administered 2016-01-21 – 2016-01-23 (×8): 600 mg via ORAL
  Filled 2016-01-21 (×8): qty 1

## 2016-01-21 MED ORDER — ZOLPIDEM TARTRATE 5 MG PO TABS: 5.0000 mg | ORAL_TABLET | Freq: Every evening | ORAL | Status: DC | PRN

## 2016-01-21 MED ORDER — DIBUCAINE 1 % RE OINT: 1.0000 "application " | TOPICAL_OINTMENT | RECTAL | Status: DC | PRN

## 2016-01-21 MED ORDER — WITCH HAZEL-GLYCERIN EX PADS: 1.0000 "application " | MEDICATED_PAD | CUTANEOUS | Status: DC | PRN

## 2016-01-21 MED ORDER — OXYCODONE HCL 5 MG PO TABS: 10.0000 mg | ORAL_TABLET | ORAL | Status: DC | PRN

## 2016-01-21 MED ORDER — BENZOCAINE-MENTHOL 20-0.5 % EX AERO
1.0000 "application " | INHALATION_SPRAY | CUTANEOUS | Status: DC | PRN
  Filled 2016-01-21: qty 56

## 2016-01-21 MED ORDER — LANOLIN HYDROUS EX OINT: TOPICAL_OINTMENT | CUTANEOUS | Status: DC | PRN

## 2016-01-21 MED ORDER — ACETAMINOPHEN 325 MG PO TABS: 650.0000 mg | ORAL_TABLET | ORAL | Status: DC | PRN

## 2016-01-21 MED ORDER — ONDANSETRON HCL 4 MG/2ML IJ SOLN: 4.0000 mg | INTRAMUSCULAR | Status: DC | PRN

## 2016-01-21 MED ORDER — SIMETHICONE 80 MG PO CHEW: 80.0000 mg | CHEWABLE_TABLET | ORAL | Status: DC | PRN

## 2016-01-21 MED ORDER — LACTATED RINGERS IV SOLN: 500.0000 mL | Freq: Once | INTRAVENOUS | Status: DC

## 2016-01-21 MED ORDER — DIPHENHYDRAMINE HCL 25 MG PO CAPS: 25.0000 mg | ORAL_CAPSULE | Freq: Four times a day (QID) | ORAL | Status: DC | PRN

## 2016-01-21 MED ORDER — SODIUM CHLORIDE 0.9 % IV SOLN
3.0000 g | Freq: Four times a day (QID) | INTRAVENOUS | Status: AC
  Administered 2016-01-21: 3 g via INTRAVENOUS
  Filled 2016-01-21 (×4): qty 3

## 2016-01-21 NOTE — Anesthesia Preprocedure Evaluation (Signed)
Anesthesia Evaluation  Patient identified by MRN, date of birth, ID band Patient awake    Reviewed: Allergy & Precautions, NPO status , Patient's Chart, lab work & pertinent test results  Airway Mallampati: II  TM Distance: >3 FB Neck ROM: Full    Dental  (+) Teeth Intact, Dental Advisory Given   Pulmonary neg pulmonary ROS,    Pulmonary exam normal breath sounds clear to auscultation       Cardiovascular negative cardio ROS Normal cardiovascular exam Rhythm:Regular Rate:Normal     Neuro/Psych  Headaches, negative psych ROS   GI/Hepatic negative GI ROS, Neg liver ROS,   Endo/Other  Obesity   Renal/GU negative Renal ROS     Musculoskeletal negative musculoskeletal ROS (+)   Abdominal   Peds  Hematology Plt 198k   Anesthesia Other Findings Day of surgery medications reviewed with the patient.  Reproductive/Obstetrics (+) Pregnancy                             Anesthesia Physical Anesthesia Plan  ASA: II  Anesthesia Plan: Epidural   Post-op Pain Management:    Induction:   Airway Management Planned:   Additional Equipment:   Intra-op Plan:   Post-operative Plan:   Informed Consent: I have reviewed the patients History and Physical, chart, labs and discussed the procedure including the risks, benefits and alternatives for the proposed anesthesia with the patient or authorized representative who has indicated his/her understanding and acceptance.   Dental advisory given  Plan Discussed with:   Anesthesia Plan Comments: (Patient identified. Risks/Benefits/Options discussed with patient including but not limited to bleeding, infection, nerve damage, paralysis, failed block, incomplete pain control, headache, blood pressure changes, nausea, vomiting, reactions to medication both or allergic, itching and postpartum back pain. Confirmed with bedside nurse the patient's most recent  platelet count. Confirmed with patient that they are not currently taking any anticoagulation, have any bleeding history or any family history of bleeding disorders. Patient expressed understanding and wished to proceed. All questions were answered. )        Anesthesia Quick Evaluation

## 2016-01-21 NOTE — Progress Notes (Signed)
Garner NashKatie C Bergevin MRN: [id number]  Subjective: -Nurse reports patient with epidural placement.  In room to assess.  Patient asleep. Did not attempt to awaken. Husband at bedside.    Objective: BP 164/80 mmHg  Pulse 104  Temp(Src) 98.5 F (36.9 C) (Oral)  Resp 16  Ht 5\' 5"  (1.651 m)  Wt 86.637 kg (191 lb)  BMI 31.78 kg/m2  SpO2 99%     Filed Vitals:   01/21/16 0013 01/21/16 0014 01/21/16 0015 01/21/16 0051  BP: 164/80 164/80 164/80   Pulse: 104 104 104   Temp:    98.5 F (36.9 C)  TempSrc:    Oral  Resp:    16  Height:      Weight:      SpO2: 99% 99% 99%     Fetal Monitoring: FHT: 135 bpm, Mod Var, -Decels, +Accels UC: Q814min    Vaginal Exam: SVE:   Dilation: 8 Effacement (%): 80 (Anterior) Station: 0 Exam by:: Gerrit HeckJessica Myka Hitz, CNM Membranes:SROM x 4 hrs Internal Monitors: None  Augmentation/Induction: Pitocin:None Cytotec: None  Assessment:  IUP at 41.4wks Cat I FT  Transitional Labor Elevated BP Afebrile  Plan: -Provider did not attempt to awaken patient, will reassess at 0300 -Will obtain a PC Ratio and if elevated will perform PIH labs -Dr. Enid BaasEV updated on patient status -Continue other mgmt as ordered  Valma CavaJessica L Avamae Dehaan,MSN, CNM 01/21/2016, 12:58 AM

## 2016-01-21 NOTE — Anesthesia Procedure Notes (Signed)
Epidural Patient location during procedure: OB  Staffing Anesthesiologist: TURK, STEPHEN EDWARD Performed by: anesthesiologist   Preanesthetic Checklist Completed: patient identified, pre-op evaluation, timeout performed, IV checked, risks and benefits discussed and monitors and equipment checked  Epidural Patient position: sitting Prep: DuraPrep Patient monitoring: blood pressure and continuous pulse ox Approach: midline Location: L3-L4 Injection technique: LOR air  Needle:  Needle type: Tuohy  Needle gauge: 17 G Needle length: 9 cm Needle insertion depth: 4 cm Catheter size: 19 Gauge Catheter at skin depth: 9 cm Test dose: negative and Other (1% Lidocaine)  Additional Notes Patient identified.  Risk benefits discussed including failed block, incomplete pain control, headache, nerve damage, paralysis, blood pressure changes, nausea, vomiting, reactions to medication both toxic or allergic, and postpartum back pain.  Patient expressed understanding and wished to proceed.  All questions were answered.  Sterile technique used throughout procedure and epidural site dressed with sterile barrier dressing. No paresthesia or other complications noted. The patient did not experience any signs of intravascular injection such as tinnitus or metallic taste in mouth nor signs of intrathecal spread such as rapid motor block. Please see nursing notes for vital signs. Reason for block:procedure for pain   

## 2016-01-21 NOTE — Progress Notes (Signed)
NSL removed pt refused IV ANTIBIOCTIC    And request it to be removed

## 2016-01-21 NOTE — Lactation Note (Signed)
This note was copied from a baby's chart. Lactation Consultation Note  Patient Name: Boy Orvilla CornwallKatie Gasbarro ZOXWR'UToday's Date: 01/21/2016 Reason for consult: Initial assessment   With this mom of a term baby, now 523 1/2 hours old. The baby was alert, and cuing. I assisted mom with latching the baby in cross cradle hold. Mom has large, soft breast, and flat nipples. I showed mom how to hold her breast to evert her nipple some, and use an asymmetrical latch to get bottom lip flanged, and a deep latch. Good breast movement seen with latch and suckles. Basic teaching done on breastfeeding from the Baby and Me book,  and lactation services reviewed. I also had dad asist with latching the baby  By holding mom's breast (teacup hold), and how to flange bottom lip. I told mom we may need to add a nipple shield at some point, but at this time baby doing well./ Mom knows to call for questions/concerns.    Maternal Data Formula Feeding for Exclusion: No Has patient been taught Hand Expression?: Yes Does the patient have breastfeeding experience prior to this delivery?: No  Feeding Feeding Type: Breast Fed Length of feed: 10 min (baby fed with active suckles for 10 minutes, and was latching again as I left the room)  LATCH Score/Interventions Latch: Repeated attempts needed to sustain latch, nipple held in mouth throughout feeding, stimulation needed to elicit sucking reflex. Intervention(s): Adjust position;Assist with latch;Breast compression  Audible Swallowing: A few with stimulation Intervention(s): Skin to skin Intervention(s): Skin to skin;Hand expression (small drops of colostrum expressed from each breast with hand expression)  Type of Nipple: Flat  Comfort (Breast/Nipple): Soft / non-tender     Hold (Positioning): Assistance needed to correctly position infant at breast and maintain latch. Intervention(s): Breastfeeding basics reviewed;Support Pillows;Position options;Skin to skin  LATCH Score:  6  Lactation Tools Discussed/Used     Consult Status Consult Status: Follow-up Date: 01/22/16 Follow-up type: In-patient    Alfred LevinsLee, Emmanuella Mirante Anne 01/21/2016, 12:35 PM

## 2016-01-21 NOTE — Progress Notes (Signed)
Heather NashKatie C Richards MRN: [id number]  Subjective: -Nurse call reports patient pushing and anterior lip noted.  Requests provider assessment.  In room to assess.  Patient reports urge to push with contractions.    Objective: BP 136/60 mmHg  Pulse 99  Temp(Src) 99.9 F (37.7 C) (Axillary)  Resp 16  Ht 5\' 5"  (1.651 m)  Wt 86.637 kg (191 lb)  BMI 31.78 kg/m2  SpO2 98%      Fetal Monitoring: FHT: 145 bpm, Mod Var, -Decels, +Accels UC: Q2-704min    Vaginal Exam: SVE:   Dilation: Lip/rim (thick anterior lip noted with pushing) Effacement (%): 100 Station: +1 Exam by::J.Hudsyn Barich, CNM Fetal Descent to +2 Station w/contractions and no maternal effort Membranes:AROM x 9hrs Internal Monitors: None  Augmentation/Induction: Pitocin:None Cytotec: None  Assessment:  IUP at 41.4wks Cat I FT  Prolonged Labor Anterior Lip-Persistent Increasing Temperature-Maternal GBS Positive-No treatment MSAF  Plan: -Discussed VE findings and fetal movement in absence of maternal pushing efforts -Discussed maternal fever, increased risk for infection (endometritis, sepsis), MSAF, and lack of GBS treatment.  Also discussed increased risk for PPH in presence of the mentioned in addition to prolonged labor. -Extensive discussion regarding antibiotic treatment for benefit of mother as a means of being proactive in management -Q/C addressed including perception that infant will be sluggish due to epidural infusion--able to clarify that this untrue -Discussed need for NICU presence at delivery as well as potential infant admission to NICU due to poor transition in presence of above assessment -Patient and husband verbalizes understanding -Agreeable to at least one dose of unasyn -Instructed to get on hands and knees to promote fetal rotation -Will reassess in 30 minutes -Dr. Enid BaasEV updated on patient status and agrees with above plan -Continue other mgmt as ordered  Heather CavaJessica L Kayce Chismar,MSN, CNM 01/21/2016, 6:23  AM

## 2016-01-21 NOTE — Anesthesia Postprocedure Evaluation (Signed)
Anesthesia Post Note  Patient: Heather NashKatie C Richards  Procedure(s) Performed: * No procedures listed *  Patient location during evaluation: Mother Baby Anesthesia Type: Epidural Level of consciousness: awake and alert and oriented Pain management: satisfactory to patient Vital Signs Assessment: post-procedure vital signs reviewed and stable Respiratory status: spontaneous breathing and nonlabored ventilation Cardiovascular status: stable Postop Assessment: no headache, no backache, no signs of nausea or vomiting, adequate PO intake and patient able to bend at knees (patient up walking) Anesthetic complications: no    Last Vitals:  Filed Vitals:   01/21/16 1202 01/21/16 1544  BP: 138/75 134/74  Pulse: 105 106  Temp: 36.7 C 37 C  Resp: 18 18    Last Pain:  Filed Vitals:   01/21/16 1547  PainSc: 0-No pain                 Gedalya Jim

## 2016-01-21 NOTE — Progress Notes (Signed)
Heather Richards MRN: [id number]  Subjective: -Patient asleep in bed.  Reports "contracting" in abdomen that causes downward pressure resulting in vaginal pressure and pain.   Objective: BP 135/67 mmHg  Pulse 101  Temp(Src) 98.7 F (37.1 C) (Oral)  Resp 18  Ht 5\' 5"  (1.651 m)  Wt 86.637 kg (191 lb)  BMI 31.78 kg/m2  SpO2 99%      Fetal Monitoring: FHT: 125 bpm, Mod Var, -Decels, +Accels UC: Q3-674min, palpates strong    Vaginal Exam: SVE:   Dilation: Lip/rim Effacement (%): 90 Station: 0 Exam by:: J.Shakila Mak. CNM Membranes:SROM x  6hrs Internal Monitors: None  Augmentation/Induction: Pitocin:None Cytotec: None  Assessment:  IUP at 41.1wks Cat I FT  Progressive Labor Transition Phase  Plan: -Patient reports taking bolus of epidural pca with no results.  Repeat and then reassess need for anesthesiologist assessment.  -Position change to promote fetal descent and rotation -Continue other mgmt as ordered -Will return in 2 hours or early to reassess   Heather CavaJessica L Blenda Wisecup,MSN, CNM 01/21/2016, 3:14 AM

## 2016-01-22 LAB — CBC
HEMATOCRIT: 26.4 % — AB (ref 36.0–46.0)
Hemoglobin: 8.9 g/dL — ABNORMAL LOW (ref 12.0–15.0)
MCH: 29 pg (ref 26.0–34.0)
MCHC: 33.7 g/dL (ref 30.0–36.0)
MCV: 86 fL (ref 78.0–100.0)
Platelets: 163 10*3/uL (ref 150–400)
RBC: 3.07 MIL/uL — AB (ref 3.87–5.11)
RDW: 14.4 % (ref 11.5–15.5)
WBC: 15.7 10*3/uL — AB (ref 4.0–10.5)

## 2016-01-22 MED ORDER — RHO D IMMUNE GLOBULIN 1500 UNIT/2ML IJ SOSY
300.0000 ug | PREFILLED_SYRINGE | Freq: Once | INTRAMUSCULAR | Status: AC
  Administered 2016-01-22: 300 ug via INTRAMUSCULAR
  Filled 2016-01-22: qty 2

## 2016-01-22 NOTE — Progress Notes (Signed)
Subjective: Postpartum Day 1: Vaginal delivery,  2nd degree deep vaginal and perineal laceration, superficial bilateral labial Patient up ad lib, reports no syncope or dizziness. Feeding:  Breast Contraceptive plan:  Paragard  Declined GBS prophylaxis, also declined pp antibiotics, ordered due to increasing temp during labor.  Objective: Vital signs in last 24 hours: Temp:  [98 F (36.7 C)-98.6 F (37 C)] 98.1 F (36.7 C) (03/31 0645) Pulse Rate:  [86-106] 86 (03/31 0645) Resp:  [18] 18 (03/31 0645) BP: (124-138)/(58-75) 124/66 mmHg (03/31 0645) SpO2:  [98 %-100 %] 98 % (03/30 1544)   Orthostatics stable.   Filed Vitals:   01/21/16 1034 01/21/16 1202 01/21/16 1544 01/22/16 0645  BP: 129/67 138/75 134/74 124/66  Pulse: 102 105 106 86  Temp: 98 F (36.7 C) 98.1 F (36.7 C) 98.6 F (37 C) 98.1 F (36.7 C)  TempSrc: Axillary     Resp: 18 18 18 18   Height:      Weight:      SpO2: 100% 99% 98%     Physical Exam:  General: alert Lochia: appropriate Uterine Fundus: firm Perineum: healing well DVT Evaluation: No evidence of DVT seen on physical exam. Negative Homan's sign.   CBC Latest Ref Rng 01/22/2016 01/19/2016 01/09/2014  WBC 4.0 - 10.5 K/uL 15.7(H) 15.7(H) 9.0  Hemoglobin 12.0 - 15.0 g/dL 1.6(X8.9(L) 09.612.8 11.8(A)  Hematocrit 36.0 - 46.0 % 26.4(L) 37.4 37.4(A)  Platelets 150 - 400 K/uL 163 198 -     Assessment/Plan: Status post vaginal delivery day 1. Anemia due to blood loss--stable orthostatics. Declined GBS prophylaxis--afebrile. Stable Continue current care. Plan for discharge tomorrow  Sitz bath to patient. Continue Fe at home as before. Declines circumcision.    [name], [name] 01/22/2016, 8:50 AM

## 2016-01-22 NOTE — Lactation Note (Signed)
This note was copied from a baby's chart. Lactation Consultation Note  Patient Name: Heather Richards ZOXWR'UToday's Date: 01/22/2016 Reason for consult: Follow-up assessment   With this baby and mom. I spoke to both dad and baby's RN, Selena BattenKim, and baby is not feeding. Mom is pumping and hand expressing, and only getting drops. Selena BattenKim is bring in Aliment[employer] with feeding amounts chart, and 5 french feeding tube and syringe, to teach parents how to finger feed.    Maternal Data    Feeding Feeding Type: Breast Fed Length of feed: 5 min  LATCH Score/Interventions Latch: Too sleepy or reluctant, no latch achieved, no sucking elicited. Intervention(s): Skin to skin  Audible Swallowing: None  Type of Nipple: Flat (tried 24 nipple shield, baby will not suckle, recessed chin) Intervention(s): Double electric pump;Hand pump  Comfort (Breast/Nipple): Soft / non-tender     Hold (Positioning): Assistance needed to correctly position infant at breast and maintain latch. Intervention(s): Support Pillows;Position options  LATCH Score: 4  Lactation Tools Discussed/Used     Consult Status Consult Status: Follow-up Date: 01/23/16    Alfred LevinsLee, Kron Everton Anne 01/22/2016, 11:09 AM

## 2016-01-22 NOTE — Lactation Note (Signed)
This note was copied from a baby's chart. Lactation Consultation Note  Patient Name: Heather Orvilla CornwallKatie Zafar ZOXWR'UToday's Date: 01/22/2016 Reason for consult: Follow-up assessment   With this mom and term infant, now 3124 hours old. Mom was not able to feed/latch the baby since 2300 yesterday , I assisted mom with latching the baby in football hold. He would not latch or attempt to suckle, I fitted mom with a 24 nipple shield, and he would not suckle with shield either.  Baby spit up some mucous. And was very sleepy. Mom is able to express opnly drops of colostrum. I asked mom.s nurse to set up aDEP, to protect mom's milk and advised mom to follow with hand expression, after initiation setting on pump. I also mentioned to mom that we may need to add formula to supplement the baby, if he does not start feeding better.  The baby has a recessed chin, and needs his chin flanged after latch. He also has a cup shaped tongue with tongue elevation. Mom knows to call for questions/concerns.   Maternal Data    Feeding Feeding Type: Breast Fed  LATCH Score/Interventions Latch: Too sleepy or reluctant, no latch achieved, no sucking elicited. Intervention(s): Skin to skin  Audible Swallowing: None  Type of Nipple: Flat (tried 24 nipple shield, baby will not suckle, recessed chin) Intervention(s): Double electric pump;Hand pump  Comfort (Breast/Nipple): Soft / non-tender     Hold (Positioning): Assistance needed to correctly position infant at breast and maintain latch. Intervention(s): Support Pillows;Position options  LATCH Score: 4  Lactation Tools Discussed/Used     Consult Status Consult Status: Follow-up Date: 01/23/16    Alfred LevinsLee, Nyema Hachey Anne 01/22/2016, 8:57 AM

## 2016-01-23 ENCOUNTER — Ambulatory Visit: Payer: Self-pay

## 2016-01-23 LAB — RH IG WORKUP (INCLUDES ABO/RH)
ABO/RH(D): O NEG
Fetal Screen: NEGATIVE
GESTATIONAL AGE(WKS): 41
UNIT DIVISION: 0

## 2016-01-23 MED ORDER — IBUPROFEN 600 MG PO TABS: 600.0000 mg | ORAL_TABLET | Freq: Four times a day (QID) | ORAL | Status: DC

## 2016-01-23 MED ORDER — MEASLES, MUMPS & RUBELLA VAC ~~LOC~~ INJ
0.5000 mL | INJECTION | Freq: Once | SUBCUTANEOUS | Status: AC
Start: 2016-01-23 — End: 2016-01-23
  Administered 2016-01-23: 0.5 mL via SUBCUTANEOUS
  Filled 2016-01-23: qty 0.5

## 2016-01-23 MED ORDER — FERROUS SULFATE 325 (65 FE) MG PO TBEC: 325.0000 mg | DELAYED_RELEASE_TABLET | Freq: Two times a day (BID) | ORAL | Status: DC

## 2016-01-23 NOTE — Discharge Summary (Signed)
[facility] Discharge Summary   Patient Name:   Heather Richards DOB:     January 29, 1986 MRN:     [id number]  Date of Admission:   01/19/2016 Date of Discharge:  01/23/2016  Admitting diagnosis:    labor Principal Problem:   Normal vaginal delivery Active Problems:   Normal labor   Positive GBS test--declines treatment   Rubella non-immune status, antepartum   Rh negative status during pregnancy  Post Term Pregnancy Delivered and Anemia    Discharge diagnosis:    labor Principal Problem:   Normal vaginal delivery Active Problems:   Normal labor   Positive GBS test--declines treatment   Rubella non-immune status, antepartum   Rh negative status during pregnancy  Post Term Pregnancy Delivered and Anemia                                                                     Post partum procedures: n/a  Type of Delivery:  SVD  Delivering Provider: Adelina Mings   Date of Delivery:  01/21/16  Newborn Data:    Live born female  Birth Weight: 8 lb 9 oz (3884 g) APGAR: 9, 9  Baby's Name:  Darlis Loan Feeding:   Breast Disposition:   home with mother  Complications:   None  Hospital course:      Onset of Labor With Vaginal Delivery     30 y.o. yo G1P1001 at [redacted]w[redacted]d was admitted in Latent Labor on 01/19/2016. Patient had an uncomplicated labor course as follows:  Membrane Rupture Time/Date: 9:09 PM ,01/20/2016   Intrapartum Procedures: Episiotomy: None [1]                                         Lacerations:  2nd degree [3];Labial [10]  Patient had a delivery of a Viable infant. 01/21/2016  Information for the patient's newborn:  Sherhonda, Gaspar [[id number]]  Delivery Method: Vag-Spont    Pateint had an uncomplicated postpartum course.  She is ambulating, tolerating a regular diet, passing flatus, and urinating well. Patient is discharged home in stable condition on 01/23/2016.    Physical Exam:   Filed Vitals:   01/21/16 1544 01/22/16 0645 01/22/16 1736  01/23/16 0554  BP: 134/74 124/66 124/58 129/73  Pulse: 106 86 94 86  Temp: 98.6 F (37 C) 98.1 F (36.7 C) 98.2 F (36.8 C) 97.5 F (36.4 C)  TempSrc:   Oral Oral  Resp: Height:      Weight:      SpO2: 98%      General: alert and cooperative Lochia: appropriate Uterine Fundus: firm Incision: Healing well with no significant drainage DVT Evaluation: No evidence of DVT seen on physical exam.  Labs: Lab Results  Component Value Date   WBC 15.7* 01/22/2016   HGB 8.9* 01/22/2016   HCT 26.4* 01/22/2016   MCV 86.0 01/22/2016   PLT 163 01/22/2016   CMP Latest Ref Rng 01/09/2014  Glucose 70 - 99 mg/dL 83  BUN 6 - 23 mg/dL 8  Creatinine 2.13 - 0.86 mg/dL 5.78  Sodium 469 - 629 mEq/L 139  Potassium 3.5 -  5.3 mEq/L 4.6  Chloride 96 - 112 mEq/L 104  CO2 19 - 32 mEq/L 27  Calcium 8.4 - 10.5 mg/dL 9.2  Total Protein 6.0 - 8.3 g/dL 6.8  Total Bilirubin 0.2 - 1.2 mg/dL 0.3  Alkaline Phos 39 - 117 U/L 54  AST 0 - 37 U/L 15  ALT 0 - 35 U/L 18    Discharge instruction: per After Visit Summary and "Baby and Me Booklet".  After Visit Meds:    Medication List    STOP taking these medications        CRANBERRY PO     GARLIC PO     tolnaftate 1 % cream  Commonly known as:  TINACTIN      TAKE these medications        ibuprofen 600 MG tablet  Commonly known as:  ADVIL,MOTRIN  Take 1 tablet (600 mg total) by mouth every 6 (six) hours.     prenatal multivitamin Tabs tablet  Take 1 tablet by mouth daily at 12 noon.        Diet: routine diet  Activity: Advance as tolerated. Pelvic rest for 6 weeks.   Outpatient follow up:6 weeks Follow up Appt:No future appointments. Follow up visit: No Follow-up on file.  Postpartum contraception: IUD Paragard  01/23/2016 Chinedu Agustin, CNM

## 2016-01-23 NOTE — Lactation Note (Signed)
This note was copied from a baby's chart. Lactation Consultation Note: Mother assist with latching infant to the (L) breast. Infant sustained latch for 20-25 mins. Observed frequent suckling and swallows. Mother taught breast compression. Mother excited to see infant have such a good feeding. Mother advised to allow infant to cluster feed.  Discussed treatment plan to prevent severe engorgement. Mother advised to feed infant 8-12 times in 24 hours. Mother has comfort gels . Her nipples are pink bilaterally. [facility]   Patient Name: Heather Richards KZSWF'UToday's Date: 01/23/2016 Reason for consult: Follow-up assessment   Maternal Data    Feeding Feeding Type: Breast Fed Length of feed: 50 min  LATCH Score/Interventions Latch: Grasps breast easily, tongue down, lips flanged, rhythmical sucking.  Audible Swallowing: A few with stimulation  Type of Nipple: Flat  Comfort (Breast/Nipple): Soft / non-tender     Hold (Positioning): No assistance needed to correctly position infant at breast.  LATCH Score: 8  Lactation Tools Discussed/Used     Consult Status      Heather Richards, Heather Richards 01/23/2016, 11:12 AM

## 2017-07-20 ENCOUNTER — Other Ambulatory Visit: Payer: Self-pay | Admitting: Endocrinology

## 2017-07-20 DIAGNOSIS — E063 Autoimmune thyroiditis: Secondary | ICD-10-CM

## 2017-07-28 ENCOUNTER — Ambulatory Visit
Admission: RE | Admit: 2017-07-28 | Discharge: 2017-07-28 | Disposition: A | Discharge status: Home / Self Care | Payer: BLUE CROSS/BLUE SHIELD | Source: Ambulatory Visit | Attending: Endocrinology | Admitting: Endocrinology

## 2017-07-28 DIAGNOSIS — E063 Autoimmune thyroiditis: Secondary | ICD-10-CM

## 2018-11-26 DIAGNOSIS — J029 Acute pharyngitis, unspecified: Secondary | ICD-10-CM | POA: Diagnosis not present

## 2018-12-27 DIAGNOSIS — R5383 Other fatigue: Secondary | ICD-10-CM | POA: Diagnosis not present

## 2018-12-27 DIAGNOSIS — E063 Autoimmune thyroiditis: Secondary | ICD-10-CM | POA: Diagnosis not present

## 2018-12-27 DIAGNOSIS — E039 Hypothyroidism, unspecified: Secondary | ICD-10-CM | POA: Diagnosis not present

## 2019-01-07 DIAGNOSIS — E069 Thyroiditis, unspecified: Secondary | ICD-10-CM | POA: Diagnosis not present

## 2019-01-07 DIAGNOSIS — Z77120 Contact with and (suspected) exposure to mold (toxic): Secondary | ICD-10-CM | POA: Diagnosis not present

## 2019-01-07 DIAGNOSIS — N946 Dysmenorrhea, unspecified: Secondary | ICD-10-CM | POA: Diagnosis not present

## 2019-01-07 DIAGNOSIS — R5383 Other fatigue: Secondary | ICD-10-CM | POA: Diagnosis not present

## 2019-01-07 DIAGNOSIS — E039 Hypothyroidism, unspecified: Secondary | ICD-10-CM | POA: Diagnosis not present

## 2019-01-23 ENCOUNTER — Telehealth: Payer: Self-pay | Admitting: General Practice

## 2019-01-23 DIAGNOSIS — B379 Candidiasis, unspecified: Secondary | ICD-10-CM

## 2019-01-23 MED ORDER — NYSTATIN 100000 UNIT/GM EX CREA: 1.0000 "application " | TOPICAL_CREAM | Freq: Every day | CUTANEOUS | 0 refills | Status: AC

## 2019-01-23 NOTE — Telephone Encounter (Signed)
Patient called reporting taking a z pack in February and shortly after started to have vaginal irritation. She reports self treating at home with monistat and when irritation got worse recently she had a telehealth visit and was given diflucan x 2 doses. Patient reports taking diflucan 3/19 and 3/22- states it mostly worked but then started to have irritation again. She started using monistat last week and vaginal boric acid for the past three nights. She reports worsening irritation, burning and itching yesterday. States it is mostly on inside of labia versus inside of vagina. Per Dr Shawnie Pons, patient should take Nystatin cream 100,000 units qhs vaginally x 14 days. Patient should also continue to use boric acid twice a week. Informed patient. Patient verbalized understanding to all & had no questions. Rx phoned in to [facility].

## 2019-02-04 ENCOUNTER — Other Ambulatory Visit: Payer: Self-pay

## 2019-02-04 ENCOUNTER — Ambulatory Visit (INDEPENDENT_AMBULATORY_CARE_PROVIDER_SITE_OTHER): Payer: BLUE CROSS/BLUE SHIELD | Admitting: Advanced Practice Midwife

## 2019-02-04 ENCOUNTER — Encounter: Payer: Self-pay | Admitting: Advanced Practice Midwife

## 2019-02-04 VITALS — BP 119/73 | HR 97 | Temp 98.5°F | Wt 167.0 lb

## 2019-02-04 DIAGNOSIS — B373 Candidiasis of vulva and vagina: Secondary | ICD-10-CM | POA: Diagnosis not present

## 2019-02-04 DIAGNOSIS — Z124 Encounter for screening for malignant neoplasm of cervix: Secondary | ICD-10-CM

## 2019-02-04 DIAGNOSIS — R300 Dysuria: Secondary | ICD-10-CM | POA: Diagnosis not present

## 2019-02-04 DIAGNOSIS — Z1151 Encounter for screening for human papillomavirus (HPV): Secondary | ICD-10-CM | POA: Diagnosis not present

## 2019-02-04 DIAGNOSIS — Z113 Encounter for screening for infections with a predominantly sexual mode of transmission: Secondary | ICD-10-CM | POA: Diagnosis not present

## 2019-02-04 DIAGNOSIS — N898 Other specified noninflammatory disorders of vagina: Secondary | ICD-10-CM

## 2019-02-04 DIAGNOSIS — Z01419 Encounter for gynecological examination (general) (routine) without abnormal findings: Secondary | ICD-10-CM

## 2019-02-04 LAB — POCT URINALYSIS DIP (DEVICE)
Bilirubin Urine: NEGATIVE
Glucose, UA: NEGATIVE mg/dL
Hgb urine dipstick: NEGATIVE
Ketones, ur: NEGATIVE mg/dL
Nitrite: NEGATIVE
Protein, ur: NEGATIVE mg/dL
Specific Gravity, Urine: 1.02 (ref 1.005–1.030)
Urobilinogen, UA: 0.2 mg/dL (ref 0.0–1.0)
pH: 6.5 (ref 5.0–8.0)

## 2019-02-04 NOTE — Progress Notes (Signed)
error 

## 2019-02-04 NOTE — Progress Notes (Signed)
Patient ID: Heather Richards, female   DOB: May 08, 1986, 33 y.o.   MRN: [id number] GYNECOLOGY ANNUAL PREVENTATIVE CARE ENCOUNTER NOTE  Subjective:   Heather Richards is a 33 y.o. G87P1001 female here for a routine annual gynecologic exam.  Current complaints: vaginal discharge and dysuria.      Heather Richards is a 33 y.o. G1P1001 who presents today with vulvar and vaginal itching that has been refractory to multiple treatments. She is now having dysuria and flank pain and subjective fevers. She states that the back pain started 02/02/2019 and then today she had temp of 99.1 at home. She did not take anything for fever, but it has resolved. She states that she was started on antibiotic for strep-throat in January/February. She states that she has had ongoing issues since then.   Vaginal Discharge  The patient's primary symptoms include genital itching and vaginal discharge. This is a new problem. The current episode started more than 1 month ago. The problem occurs constantly. The problem has been unchanged. Treatments tried: Nystatin, Monistat, boric acid suppositories, diflucan.   Gynecologic History Patient's last menstrual period was 01/14/2019. Contraception: IUD paragard  Last Pap: unsure. Results were: unsure  Obstetric History OB History  Gravida Para Term Preterm AB Living  1 1 1     1   SAB TAB Ectopic Multiple Live Births        0 1    # Outcome Date GA Lbr Len/2nd Weight Sex Delivery Anes PTL Lv  1 Term 01/21/16 [redacted]w[redacted]d 40:08 / 00:57 8 lb 9 oz (3.884 kg) M Vag-Spont None, EPI  LIV    Past Medical History:  Diagnosis Date  . Allergy   . ASCUS (atypical squamous cells of undetermined significance) on Pap smear 3/13   hpv not detected  . Hx of migraines   . Thyroid disease   . Vaginal Pap smear, abnormal     Past Surgical History:  Procedure Laterality Date  . WISDOM TOOTH EXTRACTION      Current Outpatient Medications on File Prior to Visit  Medication Sig Dispense Refill  .  Boric Acid CRYS Place vaginally.    . nystatin cream (MYCOSTATIN) Apply 1 application topically at bedtime for 14 days. 30 g 0  . thyroid (ARMOUR) 90 MG tablet Take 90 mg by mouth daily.    . ferrous sulfate 325 (65 FE) MG EC tablet Take 1 tablet (325 mg total) by mouth 2 (two) times daily. 60 tablet 3  . ibuprofen (ADVIL,MOTRIN) 600 MG tablet Take 1 tablet (600 mg total) by mouth every 6 (six) hours. (Patient not taking: Reported on 02/04/2019) 30 tablet 0  . Prenatal Vit-Fe Fumarate-FA (PRENATAL MULTIVITAMIN) TABS tablet Take 1 tablet by mouth daily at 12 noon.     No current facility-administered medications on file prior to visit.     No Known Allergies  Social History   Socioeconomic History  . Marital status: Married    Spouse name: Not on file  . Number of children: Not on file  . Years of education: Not on file  . Highest education level: Not on file  Occupational History  . Not on file  Social Needs  . Financial resource strain: Not hard at all  . Food insecurity:    Worry: Never true    Inability: Never true  . Transportation needs:    Medical: No    Non-medical: No  Tobacco Use  . Smoking status: Never Smoker  . Smokeless tobacco: Never  Used  Substance and Sexual Activity  . Alcohol use: Yes    Comment: occasional  . Drug use: No  . Sexual activity: Yes    Birth control/protection: I.U.D.  Lifestyle  . Physical activity:    Days per week: Not on file    Minutes per session: Not on file  . Stress: Not on file  Relationships  . Social connections:    Talks on phone: Not on file    Gets together: Not on file    Attends religious service: Not on file    Active member of club or organization: Not on file    Attends meetings of clubs or organizations: Not on file    Relationship status: Not on file  . Intimate partner violence:    Fear of current or ex partner: No    Emotionally abused: No    Physically abused: No    Forced sexual activity: No  Other  Topics Concern  . Not on file  Social History Narrative  . Not on file    Family History  Problem Relation Age of Onset  . Cancer Maternal Aunt        colon  . Cancer Maternal Uncle        brain  . Cancer Paternal Uncle        lung  . Heart disease Maternal Grandfather     The following portions of the patient's history were reviewed and updated as appropriate: allergies, current medications, past family history, past medical history, past social history, past surgical history and problem list.  Review of Systems Pertinent items noted in HPI and remainder of comprehensive ROS otherwise negative.   Objective:  BP 119/73   Pulse 97   Temp 98.5 F (36.9 C)   Wt 167 lb (75.8 kg)   LMP 01/14/2019   BMI 27.79 kg/m  CONSTITUTIONAL: Well-developed, well-nourished female in no acute distress.  HENT:  Normocephalic, atraumatic, External right and left ear normal. Oropharynx is clear and moist EYES: Conjunctivae and EOM are normal. Pupils are equal, round, and reactive to light. No scleral icterus.  NECK: Normal range of motion, supple, no masses.  Normal thyroid.  SKIN: Skin is warm and dry. No rash noted. Not diaphoretic. No erythema. No pallor. NEUROLOGIC: Alert and oriented to person, place, and time. Normal reflexes, muscle tone coordination. No cranial nerve deficit noted. PSYCHIATRIC: Normal mood and affect. Normal behavior. Normal judgment and thought content. CARDIOVASCULAR: Normal heart rate noted, regular rhythm RESPIRATORY: Clear to auscultation bilaterally. Effort and breath sounds normal, no problems with respiration noted. ABDOMEN: Soft, normal bowel sounds, no distention noted.  No tenderness, rebound or guarding.  PELVIC: External genitalia with irritation noted; normal appearing vaginal mucosa and cervix.  Minimal white discharge noted.  Pap smear obtained.  Normal uterine size, no other palpable masses, no uterine or adnexal tenderness. MUSCULOSKELETAL: Normal range  of motion. No tenderness.  No cyanosis, clubbing, or edema.  2+ distal pulses.   Assessment and Plan:  1. Vaginal discharge - Cervicovaginal ancillary only( [place])  2. Dysuria - Urine Culture  3. Well woman exam - Cytology - PAP( [place])  Will follow up results of pap smear and manage accordingly. Routine preventative health maintenance measures emphasized. Please refer to After Visit Summary for other counseling recommendations.   Thressa ShellerHeather Hogan DNP, CNM  02/04/19  4:55 PM

## 2019-02-06 LAB — CERVICOVAGINAL ANCILLARY ONLY
Bacterial vaginitis: NEGATIVE
Candida vaginitis: POSITIVE — AB
Chlamydia: NEGATIVE
Neisseria Gonorrhea: NEGATIVE
Trichomonas: NEGATIVE

## 2019-02-07 LAB — URINE CULTURE

## 2019-02-07 MED ORDER — FLUCYTOSINE POWD: 5.0000 g | Freq: Every day | 0 refills | Status: DC

## 2019-02-07 NOTE — Addendum Note (Signed)
Addended by: Thressa Sheller D on: 02/07/2019 01:05 PM   Modules accepted: Orders

## 2019-02-08 ENCOUNTER — Other Ambulatory Visit: Payer: Self-pay | Admitting: Advanced Practice Midwife

## 2019-02-08 MED ORDER — NITROFURANTOIN MONOHYD MACRO 100 MG PO CAPS: 100.0000 mg | ORAL_CAPSULE | Freq: Two times a day (BID) | ORAL | 0 refills | Status: AC

## 2019-02-08 MED ORDER — FLUCYTOSINE POWD: 5.0000 g | Freq: Every day | 0 refills | Status: AC

## 2019-02-08 MED ORDER — BORIC ACID CRYS: 600.0000 mg | CRYSTALS | Freq: Two times a day (BID) | 0 refills | Status: DC

## 2019-02-08 NOTE — Progress Notes (Signed)
Patient's urine results returned showing UTI. RX sent to pharmacy. Patient also messaged that needed flucytosine sent to a different pharmacy, and needs refill on boric acid.   Thressa Sheller DNP, CNM  02/08/19  9:23 PM

## 2019-02-09 LAB — CYTOLOGY - PAP
Diagnosis: NEGATIVE
Diagnosis: REACTIVE
HPV: NOT DETECTED

## 2019-02-11 ENCOUNTER — Other Ambulatory Visit: Payer: Self-pay | Admitting: General Practice

## 2019-02-11 ENCOUNTER — Other Ambulatory Visit: Payer: Self-pay | Admitting: Advanced Practice Midwife

## 2019-02-11 MED ORDER — BORIC ACID CRYS: 600.0000 mg | CRYSTALS | Freq: Two times a day (BID) | 0 refills | Status: DC

## 2019-02-11 MED ORDER — TERCONAZOLE 0.4 % VA CREA: 1.0000 | TOPICAL_CREAM | Freq: Every day | VAGINAL | 0 refills | Status: AC

## 2019-03-01 DIAGNOSIS — Z0000 Encounter for general adult medical examination without abnormal findings: Secondary | ICD-10-CM | POA: Diagnosis not present

## 2019-03-01 DIAGNOSIS — Z13220 Encounter for screening for lipoid disorders: Secondary | ICD-10-CM | POA: Diagnosis not present

## 2019-03-01 DIAGNOSIS — Z136 Encounter for screening for cardiovascular disorders: Secondary | ICD-10-CM | POA: Diagnosis not present

## 2019-04-04 DIAGNOSIS — E039 Hypothyroidism, unspecified: Secondary | ICD-10-CM | POA: Diagnosis not present

## 2019-04-04 DIAGNOSIS — E538 Deficiency of other specified B group vitamins: Secondary | ICD-10-CM | POA: Diagnosis not present

## 2019-04-04 DIAGNOSIS — E063 Autoimmune thyroiditis: Secondary | ICD-10-CM | POA: Diagnosis not present

## 2019-04-04 DIAGNOSIS — E559 Vitamin D deficiency, unspecified: Secondary | ICD-10-CM | POA: Diagnosis not present

## 2019-04-04 DIAGNOSIS — R5383 Other fatigue: Secondary | ICD-10-CM | POA: Diagnosis not present

## 2019-04-15 DIAGNOSIS — N946 Dysmenorrhea, unspecified: Secondary | ICD-10-CM | POA: Diagnosis not present

## 2019-04-15 DIAGNOSIS — R5383 Other fatigue: Secondary | ICD-10-CM | POA: Diagnosis not present

## 2019-04-15 DIAGNOSIS — E039 Hypothyroidism, unspecified: Secondary | ICD-10-CM | POA: Diagnosis not present

## 2019-04-15 DIAGNOSIS — E069 Thyroiditis, unspecified: Secondary | ICD-10-CM | POA: Diagnosis not present

## 2019-07-20 ENCOUNTER — Other Ambulatory Visit: Payer: Self-pay

## 2019-07-20 DIAGNOSIS — Z20822 Contact with and (suspected) exposure to covid-19: Secondary | ICD-10-CM

## 2019-07-21 LAB — NOVEL CORONAVIRUS, NAA: SARS-CoV-2, NAA: NOT DETECTED

## 2019-09-03 ENCOUNTER — Other Ambulatory Visit: Payer: Self-pay | Admitting: Cardiology

## 2019-09-03 DIAGNOSIS — Z20822 Contact with and (suspected) exposure to covid-19: Secondary | ICD-10-CM

## 2019-09-05 LAB — NOVEL CORONAVIRUS, NAA: SARS-CoV-2, NAA: NOT DETECTED

## 2019-09-07 ENCOUNTER — Other Ambulatory Visit: Payer: Self-pay | Admitting: *Deleted

## 2019-09-07 DIAGNOSIS — Z20828 Contact with and (suspected) exposure to other viral communicable diseases: Secondary | ICD-10-CM

## 2019-09-07 DIAGNOSIS — Z20822 Contact with and (suspected) exposure to covid-19: Secondary | ICD-10-CM

## 2019-09-10 LAB — NOVEL CORONAVIRUS, NAA: SARS-CoV-2, NAA: NOT DETECTED

## 2019-09-12 DIAGNOSIS — R5383 Other fatigue: Secondary | ICD-10-CM | POA: Diagnosis not present

## 2019-09-12 DIAGNOSIS — E538 Deficiency of other specified B group vitamins: Secondary | ICD-10-CM | POA: Diagnosis not present

## 2019-09-12 DIAGNOSIS — E063 Autoimmune thyroiditis: Secondary | ICD-10-CM | POA: Diagnosis not present

## 2019-09-12 DIAGNOSIS — E559 Vitamin D deficiency, unspecified: Secondary | ICD-10-CM | POA: Diagnosis not present

## 2019-09-12 DIAGNOSIS — E039 Hypothyroidism, unspecified: Secondary | ICD-10-CM | POA: Diagnosis not present

## 2019-09-25 DIAGNOSIS — E611 Iron deficiency: Secondary | ICD-10-CM | POA: Diagnosis not present

## 2019-09-25 DIAGNOSIS — E039 Hypothyroidism, unspecified: Secondary | ICD-10-CM | POA: Diagnosis not present

## 2019-09-25 DIAGNOSIS — R5383 Other fatigue: Secondary | ICD-10-CM | POA: Diagnosis not present

## 2019-09-25 DIAGNOSIS — E063 Autoimmune thyroiditis: Secondary | ICD-10-CM | POA: Diagnosis not present

## 2019-10-11 ENCOUNTER — Other Ambulatory Visit: Payer: Self-pay | Admitting: Cardiology

## 2019-10-11 DIAGNOSIS — Z20822 Contact with and (suspected) exposure to covid-19: Secondary | ICD-10-CM

## 2019-10-11 DIAGNOSIS — Z20828 Contact with and (suspected) exposure to other viral communicable diseases: Secondary | ICD-10-CM | POA: Diagnosis not present

## 2019-10-12 LAB — NOVEL CORONAVIRUS, NAA: SARS-CoV-2, NAA: NOT DETECTED

## 2019-10-21 DIAGNOSIS — Z23 Encounter for immunization: Secondary | ICD-10-CM | POA: Diagnosis not present

## 2019-10-31 DIAGNOSIS — Z20828 Contact with and (suspected) exposure to other viral communicable diseases: Secondary | ICD-10-CM | POA: Diagnosis not present

## 2019-11-02 DIAGNOSIS — Z03818 Encounter for observation for suspected exposure to other biological agents ruled out: Secondary | ICD-10-CM | POA: Diagnosis not present

## 2019-11-02 DIAGNOSIS — Z20828 Contact with and (suspected) exposure to other viral communicable diseases: Secondary | ICD-10-CM | POA: Diagnosis not present

## 2019-11-05 DIAGNOSIS — Z20828 Contact with and (suspected) exposure to other viral communicable diseases: Secondary | ICD-10-CM | POA: Diagnosis not present

## 2019-11-20 DIAGNOSIS — Z20828 Contact with and (suspected) exposure to other viral communicable diseases: Secondary | ICD-10-CM | POA: Diagnosis not present

## 2019-11-21 DIAGNOSIS — R05 Cough: Secondary | ICD-10-CM | POA: Diagnosis not present

## 2019-11-21 DIAGNOSIS — Z20828 Contact with and (suspected) exposure to other viral communicable diseases: Secondary | ICD-10-CM | POA: Diagnosis not present

## 2019-11-21 DIAGNOSIS — Z7189 Other specified counseling: Secondary | ICD-10-CM | POA: Diagnosis not present

## 2020-03-25 DIAGNOSIS — D513 Other dietary vitamin B12 deficiency anemia: Secondary | ICD-10-CM | POA: Diagnosis not present

## 2020-03-25 DIAGNOSIS — E039 Hypothyroidism, unspecified: Secondary | ICD-10-CM | POA: Diagnosis not present

## 2020-03-25 DIAGNOSIS — R5383 Other fatigue: Secondary | ICD-10-CM | POA: Diagnosis not present

## 2020-03-25 DIAGNOSIS — E559 Vitamin D deficiency, unspecified: Secondary | ICD-10-CM | POA: Diagnosis not present

## 2020-04-03 DIAGNOSIS — Z0000 Encounter for general adult medical examination without abnormal findings: Secondary | ICD-10-CM | POA: Diagnosis not present

## 2020-04-03 DIAGNOSIS — Z13220 Encounter for screening for lipoid disorders: Secondary | ICD-10-CM | POA: Diagnosis not present

## 2020-05-28 DIAGNOSIS — E039 Hypothyroidism, unspecified: Secondary | ICD-10-CM | POA: Diagnosis not present

## 2020-06-12 DIAGNOSIS — Z20828 Contact with and (suspected) exposure to other viral communicable diseases: Secondary | ICD-10-CM | POA: Diagnosis not present

## 2020-07-08 DIAGNOSIS — Z20828 Contact with and (suspected) exposure to other viral communicable diseases: Secondary | ICD-10-CM | POA: Diagnosis not present

## 2020-07-18 DIAGNOSIS — Z20828 Contact with and (suspected) exposure to other viral communicable diseases: Secondary | ICD-10-CM | POA: Diagnosis not present

## 2020-07-29 DIAGNOSIS — R5383 Other fatigue: Secondary | ICD-10-CM | POA: Diagnosis not present

## 2020-07-29 DIAGNOSIS — E559 Vitamin D deficiency, unspecified: Secondary | ICD-10-CM | POA: Diagnosis not present

## 2020-07-29 DIAGNOSIS — E039 Hypothyroidism, unspecified: Secondary | ICD-10-CM | POA: Diagnosis not present

## 2020-09-08 DIAGNOSIS — D485 Neoplasm of uncertain behavior of skin: Secondary | ICD-10-CM | POA: Diagnosis not present

## 2020-09-08 DIAGNOSIS — D225 Melanocytic nevi of trunk: Secondary | ICD-10-CM | POA: Diagnosis not present

## 2020-10-19 DIAGNOSIS — Z20828 Contact with and (suspected) exposure to other viral communicable diseases: Secondary | ICD-10-CM | POA: Diagnosis not present

## 2020-10-20 DIAGNOSIS — D485 Neoplasm of uncertain behavior of skin: Secondary | ICD-10-CM | POA: Diagnosis not present

## 2020-12-02 ENCOUNTER — Other Ambulatory Visit: Payer: Self-pay | Admitting: Physician Assistant

## 2020-12-02 DIAGNOSIS — K219 Gastro-esophageal reflux disease without esophagitis: Secondary | ICD-10-CM

## 2020-12-16 ENCOUNTER — Ambulatory Visit
Admission: RE | Admit: 2020-12-16 | Discharge: 2020-12-16 | Disposition: A | Discharge status: Home / Self Care | Payer: Commercial Managed Care - PPO | Source: Ambulatory Visit | Attending: Physician Assistant | Admitting: Physician Assistant

## 2020-12-16 ENCOUNTER — Other Ambulatory Visit: Payer: Self-pay

## 2020-12-16 ENCOUNTER — Other Ambulatory Visit: Payer: Self-pay | Admitting: Physician Assistant

## 2020-12-16 DIAGNOSIS — K219 Gastro-esophageal reflux disease without esophagitis: Secondary | ICD-10-CM

## 2021-01-18 ENCOUNTER — Other Ambulatory Visit (HOSPITAL_COMMUNITY): Payer: Self-pay | Admitting: Physician Assistant

## 2021-01-18 ENCOUNTER — Other Ambulatory Visit: Payer: Self-pay

## 2021-01-18 ENCOUNTER — Other Ambulatory Visit: Payer: Self-pay | Admitting: Physician Assistant

## 2021-01-18 DIAGNOSIS — R1013 Epigastric pain: Secondary | ICD-10-CM

## 2021-01-22 DIAGNOSIS — Z9889 Other specified postprocedural states: Secondary | ICD-10-CM

## 2021-01-22 HISTORY — DX: Other specified postprocedural states: Z98.890

## 2021-01-28 ENCOUNTER — Encounter (HOSPITAL_COMMUNITY)
Admission: RE | Admit: 2021-01-28 | Discharge: 2021-01-28 | Disposition: A | Discharge status: Home / Self Care | Payer: Commercial Managed Care - PPO | Source: Ambulatory Visit | Attending: Physician Assistant | Admitting: Physician Assistant

## 2021-01-28 ENCOUNTER — Other Ambulatory Visit: Payer: Self-pay

## 2021-01-28 DIAGNOSIS — R1013 Epigastric pain: Secondary | ICD-10-CM | POA: Diagnosis not present

## 2021-01-28 MED ORDER — TECHNETIUM TC 99M MEBROFENIN IV KIT
5.3000 | PACK | Freq: Once | INTRAVENOUS | Status: AC | PRN
  Administered 2021-01-28: 5.3 via INTRAVENOUS

## 2021-03-31 ENCOUNTER — Encounter: Payer: Self-pay | Admitting: General Practice

## 2021-03-31 ENCOUNTER — Other Ambulatory Visit: Payer: Self-pay

## 2021-03-31 ENCOUNTER — Encounter: Payer: Self-pay | Admitting: Certified Nurse Midwife

## 2021-03-31 ENCOUNTER — Ambulatory Visit (INDEPENDENT_AMBULATORY_CARE_PROVIDER_SITE_OTHER): Payer: Commercial Managed Care - PPO | Admitting: Certified Nurse Midwife

## 2021-03-31 VITALS — BP 136/75 | HR 100 | Wt 151.0 lb

## 2021-03-31 DIAGNOSIS — Z975 Presence of (intrauterine) contraceptive device: Secondary | ICD-10-CM

## 2021-03-31 MED ORDER — LEVONORGESTREL 20.1 MCG/DAY IU IUD
1.0000 | INTRAUTERINE_SYSTEM | Freq: Once | INTRAUTERINE | Status: AC
  Administered 2021-03-31: 1 via INTRAUTERINE

## 2021-04-02 NOTE — Progress Notes (Signed)
History:  Ms. Heather Richards is a 35 y.o. G1P1001 who presents to clinic today to have her Paraguard IUD removed and replaced with a Liletta IUD. Pt has been experiencing heavy menstrual bleeding since she got the Paraguard placed. Wants to switch IUDs for the Liletta to see if that helps control her heavy bleeding.   The following portions of the patient's history were reviewed and updated as appropriate: allergies, current medications, family history, past medical history, social history, past surgical history and problem list.  Last pap smear was on 02/04/2019 and was normal  Review of Systems:  Pertinent items noted in HPI and remainder of comprehensive ROS otherwise negative.   Objective:  Physical Exam BP 136/75   Pulse 100   Wt 151 lb (68.5 kg)   LMP 03/17/2021 (Exact Date)   Breastfeeding No   BMI 25.13 kg/m   Constitutional: Well-developed, well-nourished non-pregnant female in no acute distress.  HEENT: PERRLA Skin: normal color and turgor, no rash Cardiovascular: normal rate & rhythm, no murmur Respiratory: normal effort, lung sounds clear throughout GI: Abd soft, non-tender MS: Extremities nontender, no edema, normal ROM Neurologic: Alert and oriented x 4.  GU: no CVA tenderness   IUD Removal & Insertion Patient identified, informed consent performed, consent signed.   Discussed risks of irregular bleeding, cramping, infection, malpositioning or misplacement of the IUD outside the uterus which may require further procedure such as laparoscopy. Also discussed >99% contraception efficacy, increased risk of ectopic pregnancy with failure of method.   Emphasized that this did not protect against STIs, condoms recommended during all sexual encounters. Time out was performed.  Urine pregnancy test negative.  Patient was in the dorsal lithotomy position, normal external genitalia was noted.  A speculum was placed in the patient's vagina, normal discharge was noted, no lesions.  The multiparous cervix was visualized, no lesions, no abnormal discharge.  The strings of the IUD were grasped and pulled using ring forceps. The IUD was removed in its entirety.   Cervix cleaned with Betadine x 2.  Cervix stabilized with the speculum, no tenaculum or ring forceps needed. Uterus sounded to 7 cm.  IUD placed per manufacturer's recommendations.  Strings trimmed to 3 cm. Patient tolerated procedure well.   Patient was given post-procedure instructions.  She was advised to have backup contraception for one week.  Patient was also asked to check IUD strings periodically and follow up in 4 weeks for IUD check.   Assessment & Plan:  1. Contraception, device intrauterine - levonorgestrel (LILETTA) 20.1 MCG/DAY IUD 1 each  Edd Arbour, CNM, MSN, IBCLC Certified Nurse Midwife, [facility]

## 2021-04-28 ENCOUNTER — Ambulatory Visit: Payer: Commercial Managed Care - PPO | Admitting: Certified Nurse Midwife

## 2021-06-04 ENCOUNTER — Other Ambulatory Visit: Payer: Self-pay

## 2021-06-04 ENCOUNTER — Other Ambulatory Visit (HOSPITAL_COMMUNITY)
Admission: RE | Admit: 2021-06-04 | Discharge: 2021-06-04 | Disposition: A | Discharge status: Home / Self Care | Payer: Commercial Managed Care - PPO | Source: Ambulatory Visit | Attending: Certified Nurse Midwife | Admitting: Certified Nurse Midwife

## 2021-06-04 ENCOUNTER — Ambulatory Visit: Payer: Commercial Managed Care - PPO | Admitting: Certified Nurse Midwife

## 2021-06-04 ENCOUNTER — Encounter: Payer: Self-pay | Admitting: Certified Nurse Midwife

## 2021-06-04 VITALS — BP 120/89 | HR 76 | Wt 156.2 lb

## 2021-06-04 DIAGNOSIS — B373 Candidiasis of vulva and vagina: Secondary | ICD-10-CM | POA: Insufficient documentation

## 2021-06-04 DIAGNOSIS — Z975 Presence of (intrauterine) contraceptive device: Secondary | ICD-10-CM | POA: Insufficient documentation

## 2021-06-04 DIAGNOSIS — N898 Other specified noninflammatory disorders of vagina: Secondary | ICD-10-CM | POA: Diagnosis present

## 2021-06-04 DIAGNOSIS — N949 Unspecified condition associated with female genital organs and menstrual cycle: Secondary | ICD-10-CM | POA: Insufficient documentation

## 2021-06-04 DIAGNOSIS — Z30431 Encounter for routine checking of intrauterine contraceptive device: Secondary | ICD-10-CM

## 2021-06-04 DIAGNOSIS — B3731 Acute candidiasis of vulva and vagina: Secondary | ICD-10-CM | POA: Insufficient documentation

## 2021-06-04 NOTE — Progress Notes (Signed)
   Subjective:   Patient Name: Heather Richards, female   DOB: August 24, 1986, 35 y.o.  MRN: [id number]  HPI Patient here for an IUD check.  She had the Liletta IUD placed 1 month ago.  She reports cycles (she's had two) that are lighter in flow, longer in length (14 days) but much less pelvic discomfort during PMS. Feels encouraged that flow length will shorten. Also having some vaginal itching, thinks it may be a yeast infection.  Review of Systems  Constitutional: Negative for fever and chills.  Gastrointestinal: Negative for abdominal pain.  Genitourinary: Negative for vaginal discharge, vaginal pain, pelvic pain and dyspareunia.      Objective:   Physical Exam  Constitutional: She appears well-developed and well-nourished.  HENT:  Head: Normocephalic and atraumatic.  Abdominal: Soft. There is no tenderness. There is no guarding.  Genitourinary: There is no rash, tenderness or lesion on the right labia. There is no rash, tenderness or lesion on the left labia. No erythema or tenderness in the vagina. No foreign body around the vagina. No signs of injury around the vagina. Small amount of brown vaginal discharge found. Swab collected to test for yeast/BV.   Skin: Skin is warm and dry.  Psychiatric: She has a normal mood and affect. Her behavior is normal. Judgment and thought content normal.       Assessment & Plan:  1. IUD check up - IUD in place.    2. Vaginal discomfort - Swabs obtained, will follow and manage accordingly. Suggested vaginal moisturizer if yeast comes back negative since she has no notable vaginal discharge.  Follow up PRN or in one year for annual exam.  Edd Arbour, CNM, MSN, IBCLC Certified Nurse Midwife, [facility]

## 2021-06-04 NOTE — Progress Notes (Signed)
Heather Richards is here for a follow up from her IUD insertion. She stated that she does noticed a positive difference in her monthly cycles as far as the "heaviness" subsiding but stated that her cycles does last for about 14 days now (approximately). Denies any pain but stated she has occasional slight vaginal itchy.   Marit Goodwill, CMA

## 2021-06-07 LAB — CERVICOVAGINAL ANCILLARY ONLY
Bacterial Vaginitis (gardnerella): NEGATIVE
Candida Glabrata: POSITIVE — AB
Candida Vaginitis: NEGATIVE
Comment: NEGATIVE
Comment: NEGATIVE
Comment: NEGATIVE

## 2021-06-11 MED ORDER — FLUCYTOSINE POWD: 1.0000 | Freq: Every day | 0 refills | Status: DC

## 2021-06-11 NOTE — Addendum Note (Signed)
Addended by: Edd Arbour on: 06/11/2021 02:16 PM   Modules accepted: Orders

## 2021-06-16 ENCOUNTER — Other Ambulatory Visit: Payer: Self-pay | Admitting: General Practice

## 2021-06-16 DIAGNOSIS — B3731 Acute candidiasis of vulva and vagina: Secondary | ICD-10-CM

## 2021-06-16 DIAGNOSIS — B373 Candidiasis of vulva and vagina: Secondary | ICD-10-CM

## 2021-06-16 MED ORDER — AMPHOTERICIN B POWD: 5.0000 g | Freq: Every day | 0 refills | Status: DC

## 2021-06-16 MED ORDER — FLUCYTOSINE POWD: 1.0000 | Freq: Every day | 0 refills | Status: DC

## 2021-06-25 ENCOUNTER — Other Ambulatory Visit: Payer: Self-pay | Admitting: Certified Nurse Midwife

## 2021-06-25 DIAGNOSIS — B3731 Acute candidiasis of vulva and vagina: Secondary | ICD-10-CM

## 2021-06-25 DIAGNOSIS — B373 Candidiasis of vulva and vagina: Secondary | ICD-10-CM

## 2021-06-25 MED ORDER — AMPHOTERICIN B POWD: 5.0000 g | Freq: Every day | 0 refills | Status: DC

## 2021-06-25 NOTE — Progress Notes (Signed)
Candida glabrata treatment sent to compounding pharmacy, pt notified via MyChart message.

## 2021-07-07 ENCOUNTER — Other Ambulatory Visit: Payer: Self-pay | Admitting: Certified Nurse Midwife

## 2021-07-07 ENCOUNTER — Telehealth: Payer: Self-pay | Admitting: Lactation Services

## 2021-07-07 DIAGNOSIS — B373 Candidiasis of vulva and vagina: Secondary | ICD-10-CM

## 2021-07-07 DIAGNOSIS — B3731 Acute candidiasis of vulva and vagina: Secondary | ICD-10-CM

## 2021-07-07 MED ORDER — AMPHOTERICIN B POWD: 5.0000 g | Freq: Every day | 0 refills | Status: AC

## 2021-07-07 NOTE — Telephone Encounter (Signed)
Received fax from [facility] who indicates they cannot compound Amphotericin Powder.   Called [facility] and was advised they can compound the powder. Medication reordered to sent to [facility].   Called patient to let her know of change in Pharmacy, she reports she already has the prescription from [facility].

## 2021-08-03 NOTE — H&P (Signed)
Subjective:     Patient ID: Heather Richards is a 35 y.o. female.   HPI   Here for follow up discussion breast reduction. Current 36 G but does not feels this fits. Reports several year history tightness and pain over neck shoulder and upper back. Has tried specialty fitted bras, OTC pain medication, massage therapy, regular activity/stretching for over 6 month trial without relief. Notes limitation in ability to exercise or do activities with child due to breast size and pain. Denies numbness hands. Reports rashes beneath breast approximately once a month. This has continued despite hygiene measures and steroid cream use.   Wt- down 15 lb over last few years by intention, goal 130 lb.   No prior MMG. Denies FH breast or ovarian ca.   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 Has stand up walking desk. Lives with spouse and 65 yo. States done having biologic kids. Has been in process of adopting now 35 yo from [place] for over 2 years.   Review of Systems  Constitutional: Positive for fatigue.  Musculoskeletal: Positive for back pain, myalgias and neck pain.  Allergic/Immunologic: Positive for environmental allergies.    Remainder 12 point review negative    Objective:   Physical Exam Cardiovascular:     Rate and Rhythm: Normal rate and regular rhythm.     Heart sounds: Normal heart sounds.  Pulmonary:     Effort: Pulmonary effort is normal.     Breath sounds: Normal breath sounds.  Lymphadenopathy:     Upper Body:     Right upper body: No axillary adenopathy.     Left upper body: No axillary adenopathy.  Skin:    Comments: Fitzpatrick 1     +shoulder grooving Breasts: no masses, grade 3 ptosis bilateral + excoriation present beneath bilateral breasts Left > right volume SN to nipple R 28 L 41 cm BW R 20  L 21 cm Nipple to IMF R 14 L 15 cm      Assessment:     Macromastia Chronic neck and back pain Intertrigo    Plan:     At her age, can arrange  baseline MMG prior to surgery if patient desires. Patient declines this.     Chronic neck and back pain, intertrigo in setting of macromastia that has failed conservative management. The pain is not related to other diagnoses.There is a reasonable likelihood that the patient's symptoms are primarily due to macromastia. Breast reduction is likely to result in improvement of symptoms.   Reviewed reduction with anchor type scars, OP surgery, drains, post operative visits and limitations, recovery. Diminished sensation nipple and breast skin, risk of nipple loss, wound healing problems, asymmetry, incidental carcinoma, changes with wt gain/loss, aging, unacceptable cosmetic appearance reviewed. Reviewed changes with pregnancy, effect on ability to breast feed. Reviewed scar maturation over months. Reviewed cannot assure cup size.   Additional risks including but not limited to bleeding seroma hematoma infection need for additional procedures damage to adjacent structures blood clots in legs or lungs reviewed.   Anticipate 427 g resection from each breast.   Drain teaching completed. Rx for Tramadol given

## 2021-08-04 NOTE — Pre-Procedure Instructions (Signed)
Surgical Instructions   Your procedure is scheduled on Monday, October 17th. Report to [facility] Main Entrance "A" at 05:30 A.M., then check in with the Admitting office. Call this number if you have problems the morning of surgery: [phone]   If you have any questions prior to your surgery date call [phone]: Open Monday-Friday 8am-4pm   Remember: Do not eat after midnight the night before your surgery  You may drink clear liquids until 04:30 AM the morning of your surgery.   Clear liquids allowed are: Water, Non-Citrus Juices (without pulp), Carbonated Beverages, Clear Tea, Black Coffee Only, and Gatorade   Take these medicines the morning of surgery with A SIP OF WATER  thyroid (ARMOUR)    If needed: acetaminophen (TYLENOL)  fluticasone (FLONASE) loratadine (CLARITIN)    As of today, STOP taking any Aspirin (unless otherwise instructed by your surgeon) Aleve, Naproxen, Ibuprofen, Motrin, Advil, Goody's, BC's, all herbal medications, fish oil, and all vitamins.                     Do NOT Smoke (Tobacco/Vaping) or drink Alcohol 24 hours prior to your procedure.  If you use a CPAP at night, you may bring all equipment for your overnight stay.   Contacts, glasses, piercing's, hearing aid's, dentures or partials may not be worn into surgery, please bring cases for these belongings.    For patients admitted to the hospital, discharge time will be determined by your treatment team.   Patients discharged the day of surgery will not be allowed to drive home, and someone needs to stay with them for 24 hours.  NO VISITORS WILL BE ALLOWED IN PRE-OP WHERE PATIENTS GET READY FOR SURGERY.  ONLY 1 SUPPORT PERSON MAY BE PRESENT IN THE WAITING ROOM WHILE YOU ARE IN SURGERY.  IF YOU ARE TO BE ADMITTED, ONCE YOU ARE IN YOUR ROOM YOU WILL BE ALLOWED TWO (2) VISITORS.  Minor children may have two parents present. Special consideration for safety and communication needs will be reviewed on  a case by case basis.   Special instructions:   [facility]- Preparing For Surgery  Before surgery, you can play an important role. Because skin is not sterile, your skin needs to be as free of germs as possible. You can reduce the number of germs on your skin by washing with CHG (chlorahexidine gluconate) Soap before surgery.  CHG is an antiseptic cleaner which kills germs and bonds with the skin to continue killing germs even after washing.    Oral Hygiene is also important to reduce your risk of infection.  Remember - BRUSH YOUR TEETH THE MORNING OF SURGERY WITH YOUR REGULAR TOOTHPASTE  Please do not use if you have an allergy to CHG or antibacterial soaps. If your skin becomes reddened/irritated stop using the CHG.  Do not shave (including legs and underarms) for at least 48 hours prior to first CHG shower. It is OK to shave your face.  Please follow these instructions carefully.   Shower the NIGHT BEFORE SURGERY and the MORNING OF SURGERY  If you chose to wash your hair, wash your hair first as usual with your normal shampoo.  After you shampoo, rinse your hair and body thoroughly to remove the shampoo.  Use CHG Soap as you would any other liquid soap. You can apply CHG directly to the skin and wash gently with a scrungie or a clean washcloth.   Apply the CHG Soap to your body ONLY FROM THE  NECK DOWN.  Do not use on open wounds or open sores. Avoid contact with your eyes, ears, mouth and genitals (private parts). Wash Face and genitals (private parts)  with your normal soap.   Wash thoroughly, paying special attention to the area where your surgery will be performed.  Thoroughly rinse your body with warm water from the neck down.  DO NOT shower/wash with your normal soap after using and rinsing off the CHG Soap.  Pat yourself dry with a CLEAN TOWEL.  Wear CLEAN PAJAMAS to bed the night before surgery  Place CLEAN SHEETS on your bed the night before your surgery  DO NOT SLEEP  WITH PETS.   Day of Surgery: Shower with CHG soap. Do not wear jewelry, make up, nail polish, gel polish, artificial nails, or any other type of covering on natural nails including finger and toenails. If patients have artificial nails, gel coating, etc. that need to be removed by a nail salon please have this removed prior to surgery. Surgery may need to be canceled/delayed if the surgeon/ anesthesia feels like the patient is unable to be adequately monitored. Do not wear lotions, powders, perfumes, or deodorant. Do not shave 48 hours prior to surgery.   Do not bring valuables to the hospital. [facility] is not responsible for any belongings or valuables. Wear Clean/Comfortable clothing the morning of surgery Remember to brush your teeth WITH YOUR REGULAR TOOTHPASTE.   Please read over the following fact sheets that you were given.   3 days prior to your procedure or After your COVID test   You are not required to quarantine however you are required to wear a well-fitting mask when you are out and around people not in your household. If your mask becomes wet or soiled, replace with a new one.   Wash your hands often with soap and water for 20 seconds or clean your hands with an alcohol-based hand sanitizer that contains at least 60% alcohol.   Do not share personal items.   Notify your provider:  o if you are in close contact with someone who has COVID  o or if you develop a fever of 100.4 or greater, sneezing, cough, sore throat, shortness of breath or body aches.

## 2021-08-05 ENCOUNTER — Encounter (HOSPITAL_COMMUNITY): Payer: Self-pay

## 2021-08-05 ENCOUNTER — Encounter (HOSPITAL_COMMUNITY)
Admission: RE | Admit: 2021-08-05 | Discharge: 2021-08-05 | Disposition: A | Discharge status: Home / Self Care | Payer: Commercial Managed Care - PPO | Source: Ambulatory Visit | Attending: Plastic Surgery | Admitting: Plastic Surgery

## 2021-08-05 ENCOUNTER — Other Ambulatory Visit: Payer: Self-pay

## 2021-08-05 DIAGNOSIS — Z01812 Encounter for preprocedural laboratory examination: Secondary | ICD-10-CM | POA: Diagnosis present

## 2021-08-05 LAB — CBC
HCT: 43.3 % (ref 36.0–46.0)
Hemoglobin: 14.3 g/dL (ref 12.0–15.0)
MCH: 29.4 pg (ref 26.0–34.0)
MCHC: 33 g/dL (ref 30.0–36.0)
MCV: 89.1 fL (ref 80.0–100.0)
Platelets: 315 10*3/uL (ref 150–400)
RBC: 4.86 MIL/uL (ref 3.87–5.11)
RDW: 12.2 % (ref 11.5–15.5)
WBC: 8.3 10*3/uL (ref 4.0–10.5)
nRBC: 0 % (ref 0.0–0.2)

## 2021-08-05 NOTE — Progress Notes (Signed)
PCP - Ann Maki. Crissie Sickles. Cardiologist - denies  PPM/ICD - denies   Chest x-ray - 01/09/14 EKG - denies Stress Test - denies ECHO - denies Cardiac Cath - denies  Sleep Study - denies   DM- denies  Blood Thinner Instructions: n/a Aspirin Instructions: n/a  ERAS Protcol - yes, no drink   COVID TEST- n/a, ambulatory surgery   Anesthesia review:  No  Patient denies shortness of breath, fever, cough and chest pain at PAT appointment   All instructions explained to the patient, with a verbal understanding of the material. Patient agrees to go over the instructions while at home for a better understanding. Patient also instructed to wear a mask in public 3 days prior to surgery. The opportunity to ask questions was provided.

## 2021-08-09 ENCOUNTER — Encounter (HOSPITAL_COMMUNITY): Payer: Self-pay | Admitting: Plastic Surgery

## 2021-08-09 ENCOUNTER — Ambulatory Visit (HOSPITAL_COMMUNITY)
Admission: RE | Admit: 2021-08-09 | Discharge: 2021-08-09 | Disposition: A | Discharge status: Home / Self Care | Payer: Commercial Managed Care - PPO | Attending: Plastic Surgery | Admitting: Plastic Surgery

## 2021-08-09 ENCOUNTER — Ambulatory Visit (HOSPITAL_COMMUNITY): Payer: Commercial Managed Care - PPO | Admitting: Certified Registered"

## 2021-08-09 ENCOUNTER — Other Ambulatory Visit: Payer: Self-pay

## 2021-08-09 ENCOUNTER — Encounter (HOSPITAL_COMMUNITY)
Admission: RE | Disposition: A | Discharge status: Home / Self Care | Payer: Self-pay | Source: Home / Self Care | Attending: Plastic Surgery

## 2021-08-09 DIAGNOSIS — E039 Hypothyroidism, unspecified: Secondary | ICD-10-CM | POA: Insufficient documentation

## 2021-08-09 DIAGNOSIS — G8929 Other chronic pain: Secondary | ICD-10-CM | POA: Insufficient documentation

## 2021-08-09 DIAGNOSIS — N62 Hypertrophy of breast: Secondary | ICD-10-CM | POA: Diagnosis present

## 2021-08-09 DIAGNOSIS — M542 Cervicalgia: Secondary | ICD-10-CM | POA: Diagnosis not present

## 2021-08-09 DIAGNOSIS — M549 Dorsalgia, unspecified: Secondary | ICD-10-CM | POA: Insufficient documentation

## 2021-08-09 DIAGNOSIS — L304 Erythema intertrigo: Secondary | ICD-10-CM | POA: Insufficient documentation

## 2021-08-09 HISTORY — PX: BREAST REDUCTION SURGERY: SHX8

## 2021-08-09 LAB — POCT PREGNANCY, URINE: Preg Test, Ur: NEGATIVE

## 2021-08-09 SURGERY — MAMMOPLASTY, REDUCTION
Anesthesia: General | Site: Breast | Laterality: Bilateral

## 2021-08-09 MED ORDER — ACETAMINOPHEN 500 MG PO TABS: 1000.0000 mg | ORAL_TABLET | ORAL | Status: AC

## 2021-08-09 MED ORDER — ACETAMINOPHEN 500 MG PO TABS
ORAL_TABLET | ORAL | Status: AC
  Administered 2021-08-09: 500 mg via ORAL
  Filled 2021-08-09: qty 2

## 2021-08-09 MED ORDER — LIDOCAINE 2% (20 MG/ML) 5 ML SYRINGE
INTRAMUSCULAR | Status: AC
  Filled 2021-08-09: qty 5

## 2021-08-09 MED ORDER — ACETAMINOPHEN 500 MG PO TABS: 1000.0000 mg | ORAL_TABLET | Freq: Once | ORAL | Status: DC | PRN

## 2021-08-09 MED ORDER — PHENYLEPHRINE HCL-NACL 20-0.9 MG/250ML-% IV SOLN
INTRAVENOUS | Status: AC
  Filled 2021-08-09: qty 250

## 2021-08-09 MED ORDER — ACETAMINOPHEN 160 MG/5ML PO SOLN: 1000.0000 mg | Freq: Once | ORAL | Status: DC | PRN

## 2021-08-09 MED ORDER — ORAL CARE MOUTH RINSE: 15.0000 mL | Freq: Once | OROMUCOSAL | Status: AC

## 2021-08-09 MED ORDER — KETAMINE HCL 50 MG/5ML IJ SOSY
PREFILLED_SYRINGE | INTRAMUSCULAR | Status: AC
  Filled 2021-08-09: qty 5

## 2021-08-09 MED ORDER — FENTANYL CITRATE (PF) 100 MCG/2ML IJ SOLN
INTRAMUSCULAR | Status: AC
  Filled 2021-08-09: qty 2

## 2021-08-09 MED ORDER — CELECOXIB 200 MG PO CAPS
ORAL_CAPSULE | ORAL | Status: AC
  Administered 2021-08-09: 200 mg via ORAL
  Filled 2021-08-09: qty 1

## 2021-08-09 MED ORDER — GLYCOPYRROLATE PF 0.2 MG/ML IJ SOSY
PREFILLED_SYRINGE | INTRAMUSCULAR | Status: DC | PRN
  Administered 2021-08-09: .2 mg via INTRAVENOUS

## 2021-08-09 MED ORDER — SCOPOLAMINE 1 MG/3DAYS TD PT72
MEDICATED_PATCH | TRANSDERMAL | Status: DC | PRN
  Administered 2021-08-09: 1 via TRANSDERMAL

## 2021-08-09 MED ORDER — PROPOFOL 1000 MG/100ML IV EMUL
INTRAVENOUS | Status: AC
  Filled 2021-08-09: qty 100

## 2021-08-09 MED ORDER — FENTANYL CITRATE (PF) 250 MCG/5ML IJ SOLN
INTRAMUSCULAR | Status: AC
  Filled 2021-08-09: qty 5

## 2021-08-09 MED ORDER — CHLORHEXIDINE GLUCONATE CLOTH 2 % EX PADS: 6.0000 | MEDICATED_PAD | Freq: Once | CUTANEOUS | Status: DC

## 2021-08-09 MED ORDER — ROCURONIUM BROMIDE 10 MG/ML (PF) SYRINGE
PREFILLED_SYRINGE | INTRAVENOUS | Status: DC | PRN
  Administered 2021-08-09: 40 mg via INTRAVENOUS
  Administered 2021-08-09: 60 mg via INTRAVENOUS

## 2021-08-09 MED ORDER — MIDAZOLAM HCL 2 MG/2ML IJ SOLN
INTRAMUSCULAR | Status: AC
  Filled 2021-08-09: qty 2

## 2021-08-09 MED ORDER — PROPOFOL 10 MG/ML IV BOLUS
INTRAVENOUS | Status: AC
  Filled 2021-08-09: qty 20

## 2021-08-09 MED ORDER — PROPOFOL 10 MG/ML IV BOLUS
INTRAVENOUS | Status: DC | PRN
  Administered 2021-08-09: 140 mg via INTRAVENOUS

## 2021-08-09 MED ORDER — LACTATED RINGERS IV SOLN: INTRAVENOUS | Status: DC

## 2021-08-09 MED ORDER — LIDOCAINE 2% (20 MG/ML) 5 ML SYRINGE
INTRAMUSCULAR | Status: DC | PRN
  Administered 2021-08-09: 60 mg via INTRAVENOUS

## 2021-08-09 MED ORDER — ROCURONIUM BROMIDE 10 MG/ML (PF) SYRINGE
PREFILLED_SYRINGE | INTRAVENOUS | Status: AC
  Filled 2021-08-09: qty 10

## 2021-08-09 MED ORDER — ACETAMINOPHEN 10 MG/ML IV SOLN
1000.0000 mg | Freq: Once | INTRAVENOUS | Status: DC | PRN
  Administered 2021-08-09: 1000 mg via INTRAVENOUS

## 2021-08-09 MED ORDER — OXYCODONE HCL 5 MG PO TABS: 5.0000 mg | ORAL_TABLET | Freq: Once | ORAL | Status: DC | PRN

## 2021-08-09 MED ORDER — FENTANYL CITRATE (PF) 100 MCG/2ML IJ SOLN
25.0000 ug | INTRAMUSCULAR | Status: DC | PRN
  Administered 2021-08-09: 50 ug via INTRAVENOUS

## 2021-08-09 MED ORDER — SUGAMMADEX SODIUM 200 MG/2ML IV SOLN
INTRAVENOUS | Status: DC | PRN
  Administered 2021-08-09: 150 mg via INTRAVENOUS

## 2021-08-09 MED ORDER — ACETAMINOPHEN 10 MG/ML IV SOLN
INTRAVENOUS | Status: AC
  Filled 2021-08-09: qty 100

## 2021-08-09 MED ORDER — MIDAZOLAM HCL 5 MG/5ML IJ SOLN
INTRAMUSCULAR | Status: DC | PRN
  Administered 2021-08-09: 2 mg via INTRAVENOUS

## 2021-08-09 MED ORDER — ONDANSETRON HCL 4 MG/2ML IJ SOLN
INTRAMUSCULAR | Status: AC
  Filled 2021-08-09: qty 2

## 2021-08-09 MED ORDER — PROPOFOL 1000 MG/100ML IV EMUL
INTRAVENOUS | Status: AC
  Filled 2021-08-09: qty 200

## 2021-08-09 MED ORDER — CEFAZOLIN SODIUM-DEXTROSE 2-4 GM/100ML-% IV SOLN
2.0000 g | INTRAVENOUS | Status: AC
  Administered 2021-08-09: 2 g via INTRAVENOUS

## 2021-08-09 MED ORDER — OXYCODONE HCL 5 MG/5ML PO SOLN: 5.0000 mg | Freq: Once | ORAL | Status: DC | PRN

## 2021-08-09 MED ORDER — ONDANSETRON HCL 4 MG/2ML IJ SOLN
INTRAMUSCULAR | Status: DC | PRN
  Administered 2021-08-09: 4 mg via INTRAVENOUS

## 2021-08-09 MED ORDER — BUPIVACAINE HCL (PF) 0.5 % IJ SOLN
INTRAMUSCULAR | Status: AC
  Filled 2021-08-09: qty 30

## 2021-08-09 MED ORDER — CHLORHEXIDINE GLUCONATE 0.12 % MT SOLN
OROMUCOSAL | Status: AC
  Administered 2021-08-09: 15 mL via OROMUCOSAL
  Filled 2021-08-09: qty 15

## 2021-08-09 MED ORDER — CHLORHEXIDINE GLUCONATE 0.12 % MT SOLN: 15.0000 mL | Freq: Once | OROMUCOSAL | Status: AC

## 2021-08-09 MED ORDER — BUPIVACAINE HCL (PF) 0.5 % IJ SOLN
INTRAMUSCULAR | Status: DC | PRN
  Administered 2021-08-09: 30 mL

## 2021-08-09 MED ORDER — KETAMINE HCL 10 MG/ML IJ SOLN
INTRAMUSCULAR | Status: DC | PRN
  Administered 2021-08-09: 50 mg via INTRAVENOUS

## 2021-08-09 MED ORDER — CEFAZOLIN SODIUM-DEXTROSE 2-4 GM/100ML-% IV SOLN
INTRAVENOUS | Status: AC
  Filled 2021-08-09: qty 100

## 2021-08-09 MED ORDER — FENTANYL CITRATE (PF) 100 MCG/2ML IJ SOLN
INTRAMUSCULAR | Status: DC | PRN
  Administered 2021-08-09 (×2): 50 ug via INTRAVENOUS
  Administered 2021-08-09: 100 ug via INTRAVENOUS
  Administered 2021-08-09: 50 ug via INTRAVENOUS

## 2021-08-09 MED ORDER — CELECOXIB 200 MG PO CAPS: 200.0000 mg | ORAL_CAPSULE | ORAL | Status: AC

## 2021-08-09 MED ORDER — 0.9 % SODIUM CHLORIDE (POUR BTL) OPTIME
TOPICAL | Status: DC | PRN
  Administered 2021-08-09: 200 mL

## 2021-08-09 MED ORDER — SCOPOLAMINE 1 MG/3DAYS TD PT72
MEDICATED_PATCH | TRANSDERMAL | Status: AC
  Filled 2021-08-09: qty 1

## 2021-08-09 MED ORDER — PROPOFOL 500 MG/50ML IV EMUL
INTRAVENOUS | Status: DC | PRN
  Administered 2021-08-09: 100 ug/kg/min via INTRAVENOUS

## 2021-08-09 SURGICAL SUPPLY — 43 items
BAG COUNTER SPONGE SURGICOUNT (BAG) ×2 IMPLANT
BINDER BREAST LRG (GAUZE/BANDAGES/DRESSINGS) ×2 IMPLANT
BINDER BREAST MEDIUM (GAUZE/BANDAGES/DRESSINGS) IMPLANT
BINDER BREAST XLRG (GAUZE/BANDAGES/DRESSINGS) IMPLANT
BINDER BREAST XXLRG (GAUZE/BANDAGES/DRESSINGS) IMPLANT
BLADE SURG 10 STRL SS (BLADE) ×8 IMPLANT
BNDG GAUZE ELAST 4 BULKY (GAUZE/BANDAGES/DRESSINGS) ×4 IMPLANT
CANISTER SUCT 3000ML PPV (MISCELLANEOUS) ×2 IMPLANT
CHLORAPREP W/TINT 26 (MISCELLANEOUS) ×4 IMPLANT
DECANTER SPIKE VIAL GLASS SM (MISCELLANEOUS) IMPLANT
DERMABOND ADVANCED (GAUZE/BANDAGES/DRESSINGS) ×3
DERMABOND ADVANCED .7 DNX12 (GAUZE/BANDAGES/DRESSINGS) ×3 IMPLANT
DRAIN CHANNEL 15F RND FF W/TCR (WOUND CARE) IMPLANT
DRAPE HALF SHEET 40X57 (DRAPES) ×4 IMPLANT
DRAPE ORTHO SPLIT 77X108 STRL (DRAPES)
DRAPE SURG ORHT 6 SPLT 77X108 (DRAPES) IMPLANT
DRAPE TOP ARMCOVERS (MISCELLANEOUS) ×2 IMPLANT
DRAPE U-SHAPE 76X120 STRL (DRAPES) ×2 IMPLANT
DRSG PAD ABDOMINAL 8X10 ST (GAUZE/BANDAGES/DRESSINGS) ×4 IMPLANT
ELECT BLADE 4.0 EZ CLEAN MEGAD (MISCELLANEOUS)
ELECT COATED BLADE 2.86 ST (ELECTRODE) ×2 IMPLANT
ELECT REM PT RETURN 9FT ADLT (ELECTROSURGICAL) ×2
ELECTRODE BLDE 4.0 EZ CLN MEGD (MISCELLANEOUS) IMPLANT
ELECTRODE REM PT RTRN 9FT ADLT (ELECTROSURGICAL) ×1 IMPLANT
EVACUATOR SILICONE 100CC (DRAIN) ×4 IMPLANT
GLOVE SURG ENC MOIS LTX SZ6 (GLOVE) ×4 IMPLANT
GOWN STRL REUS W/ TWL LRG LVL3 (GOWN DISPOSABLE) ×3 IMPLANT
GOWN STRL REUS W/TWL LRG LVL3 (GOWN DISPOSABLE) ×3
MARKER SKIN DUAL TIP RULER LAB (MISCELLANEOUS) IMPLANT
NEEDLE HYPO 25GX1X1/2 BEV (NEEDLE) ×2 IMPLANT
NS IRRIG 1000ML POUR BTL (IV SOLUTION) ×2 IMPLANT
PACK GENERAL/GYN (CUSTOM PROCEDURE TRAY) ×2 IMPLANT
PIN SAFETY STERILE (MISCELLANEOUS) ×2 IMPLANT
SPONGE T-LAP 18X18 ~~LOC~~+RFID (SPONGE) ×4 IMPLANT
STAPLER VISISTAT 35W (STAPLE) ×2 IMPLANT
SUT ETHILON 2 0 FS 18 (SUTURE) ×4 IMPLANT
SUT MNCRL AB 4-0 PS2 18 (SUTURE) ×8 IMPLANT
SUT VIC AB 3-0 PS1 18 (SUTURE) ×6
SUT VIC AB 3-0 PS1 18XBRD (SUTURE) ×6 IMPLANT
SUT VICRYL 4-0 PS2 18IN ABS (SUTURE) ×4 IMPLANT
SYR CONTROL 10ML LL (SYRINGE) ×2 IMPLANT
TOWEL GREEN STERILE (TOWEL DISPOSABLE) ×2 IMPLANT
WATER STERILE IRR 1000ML POUR (IV SOLUTION) ×2 IMPLANT

## 2021-08-09 NOTE — Anesthesia Preprocedure Evaluation (Signed)
Anesthesia Evaluation  Patient identified by MRN, date of birth, ID band Patient awake    Reviewed: Allergy & Precautions, NPO status , Patient's Chart, lab work & pertinent test results  History of Anesthesia Complications Negative for: history of anesthetic complications  Airway Mallampati: I  TM Distance: >3 FB Neck ROM: Full    Dental  (+) Dental Advisory Given, Teeth Intact   Pulmonary neg pulmonary ROS,    breath sounds clear to auscultation       Cardiovascular negative cardio ROS   Rhythm:Regular     Neuro/Psych negative neurological ROS  negative psych ROS   GI/Hepatic negative GI ROS, Neg liver ROS,   Endo/Other  Hypothyroidism   Renal/GU negative Renal ROS     Musculoskeletal negative musculoskeletal ROS (+)   Abdominal   Peds  Hematology Lab Results      Component                Value               Date                      WBC                      8.3                 08/05/2021                HGB                      14.3                08/05/2021                HCT                      43.3                08/05/2021                MCV                      89.1                08/05/2021                PLT                      315                 08/05/2021              Anesthesia Other Findings   Reproductive/Obstetrics                             Anesthesia Physical Anesthesia Plan  ASA: 2  Anesthesia Plan: General   Post-op Pain Management:    Induction: Intravenous  PONV Risk Score and Plan: 3 and Ondansetron, Dexamethasone, Propofol infusion and TIVA  Airway Management Planned: Oral ETT  Additional Equipment: None  Intra-op Plan:   Post-operative Plan: Extubation in OR  Informed Consent: I have reviewed the patients History and Physical, chart, labs and discussed the procedure including the risks, benefits and alternatives for the proposed anesthesia  with the patient or authorized representative who has indicated his/her understanding  and acceptance.     Dental advisory given  Plan Discussed with: CRNA and Anesthesiologist  Anesthesia Plan Comments:         Anesthesia Quick Evaluation

## 2021-08-09 NOTE — Transfer of Care (Signed)
Immediate Anesthesia Transfer of Care Note  Patient: Heather Richards  Procedure(s) Performed: MAMMARY REDUCTION  (BREAST) (Bilateral: Breast)  Patient Location: PACU  Anesthesia Type:General  Level of Consciousness: drowsy, patient cooperative and responds to stimulation  Airway & Oxygen Therapy: Patient Spontanous Breathing  Post-op Assessment: Report given to RN, Post -op Vital signs reviewed and stable and Patient moving all extremities X 4  Post vital signs: Reviewed and stable  Last Vitals:  Vitals Value Taken Time  BP 126/82 08/09/21 1032  Temp    Pulse 79 08/09/21 1032  Resp 15 08/09/21 1032  SpO2 97 % 08/09/21 1032  Vitals shown include unvalidated device data.  Last Pain:  Vitals:   08/09/21 0602  TempSrc:   PainSc: 0-No pain      Patients Stated Pain Goal: 1 (08/09/21 0602)  Complications: No notable events documented.

## 2021-08-09 NOTE — Progress Notes (Signed)
Nursing Progress Note  Wasting of Fentanyl  Pt had been discharged in system, unable to waste in Pyxis. Wasted med with Harold Barban RN Asst Dir Pacu.   Misty Stanley RN

## 2021-08-09 NOTE — Anesthesia Procedure Notes (Signed)
Procedure Name: Intubation Date/Time: 08/09/2021 7:34 AM Performed by: Cathren Harsh, CRNA Pre-anesthesia Checklist: Patient identified, Emergency Drugs available, Suction available and Patient being monitored Patient Re-evaluated:Patient Re-evaluated prior to induction Oxygen Delivery Method: Circle System Utilized Preoxygenation: Pre-oxygenation with 100% oxygen Induction Type: IV induction Ventilation: Mask ventilation without difficulty Laryngoscope Size: 4 and Mac Grade View: Grade II Tube type: Oral Tube size: 7.0 mm Number of attempts: 1 Airway Equipment and Method: Stylet and Oral airway Placement Confirmation: ETT inserted through vocal cords under direct vision, positive ETCO2 and breath sounds checked- equal and bilateral Secured at: 21 cm Tube secured with: Tape Dental Injury: Teeth and Oropharynx as per pre-operative assessment  Comments: Recommend mac 3 for future intubation attempts.  Mac 3 blade unavailable for this intubation.

## 2021-08-09 NOTE — Op Note (Signed)
Operative Note   DATE OF OPERATION: 10.17.22  LOCATION: [facility] Main OR-outpatient  SURGICAL DIVISION: Plastic Surgery  PREOPERATIVE DIAGNOSES:  1. Macromastia 2. Chronic neck and back pain 3. Intertrigo  POSTOPERATIVE DIAGNOSES:  same  PROCEDURE:  Bilateral breast reduction  SURGEON: Glenna Fellows MD MBA  ASSISTANT: A Hall RNFA  ANESTHESIA:  General.   EBL: 100 ml  COMPLICATIONS: None immediate.   INDICATIONS FOR PROCEDURE:  The patient, Heather Richards, is a 34 y.o. female born on 1986-01-13, is here for treatment chronic neck and back pain, intertrigo in setting of macromastia that has failed conservative measures.   FINDINGS: Right reduction 347 g Left reduction 503 g  DESCRIPTION OF PROCEDURE:  The patient was marked standing in the preoperative area to mark sternal notch, chest midline, anterior axillary lines, inframammary folds. The location of new nipple areolar complex was marked at level of on inframammary fold on anterior surface breast by palpation. This was marked symmetric over bilateral breasts. With aid of Wise pattern marker, location of new nipple areolar complex and vertical limbs (6 cm) were marked by displacement of breasts along meridian. The patient was taken to the operating room. SCDs were placed and IV antibiotics were given. The patient's operative site was prepped and draped in a sterile fashion. A time out was performed and all information was confirmed to be correct.     Over left breast, superomedial pedicle marked and nipple areolar complex incised with 45 mm diameter marker. Pedicle deepithlialized and developed to chest wall. Breast tissue resected over lower pole. Medial and lateral flaps developed. Additional superior pole and lateral chest wall tissue excised. Breast tailor tacked closed.    I then directed attention to right breast where superomedial pedicle designed. NAC marked with 45 mm diameter marker. The pedicle was deepithelialized. Pedicle  developed until tension free rotation was possible. Breast tissue resected over lower pole. Medial and lateral flaps developed. Additional superior pole and lateral chest wall tissue excised. Breast tailor tacked closed, and patient brought to upright sitting position and assessed for symmetry. Patent returned to supine position. Breast cavities irrigated and hemostasis obtained. Local anesthetic infiltrated throughout each breast. 15 Fr JP placed in each breast and secured with 2-0 nylon. Closure completed bilateral with 3-0 vicryl to approximate dermis along inframammary fold and vertical limb. NAC inset with 4-0 vicryl in dermis. Skin closure completed with 4-0 monocryl subcuticular throughout. Tissue adhesive applied. Dry dressing and breast binder applied.  The patient was allowed to wake from anesthesia, extubated and taken to the recovery room in satisfactory condition.   SPECIMENS: right and left breast reduction  DRAINS: 15 Fr JP in right and left breast

## 2021-08-09 NOTE — Interval H&P Note (Signed)
History and Physical Interval Note:  08/09/2021 6:41 AM  Heather Richards  has presented today for surgery, with the diagnosis of macromastia, chronic neck and back pain, intertrigo.  The various methods of treatment have been discussed with the patient and family. After consideration of risks, benefits and other options for treatment, the patient has consented to  Procedure(s): MAMMARY REDUCTION  (BREAST) (Bilateral) as a surgical intervention.  The patient's history has been reviewed, patient examined, no change in status, stable for surgery.  I have reviewed the patient's chart and labs.  Questions were answered to the patient's satisfaction.     Irean Hong Gwendoline Judy

## 2021-08-10 ENCOUNTER — Encounter (HOSPITAL_COMMUNITY): Payer: Self-pay | Admitting: Plastic Surgery

## 2021-08-10 LAB — SURGICAL PATHOLOGY

## 2021-08-10 NOTE — Anesthesia Postprocedure Evaluation (Signed)
Anesthesia Post Note  Patient: Heather Richards  Procedure(s) Performed: MAMMARY REDUCTION  (BREAST) (Bilateral: Breast)     Patient location during evaluation: PACU Anesthesia Type: General Level of consciousness: awake and alert Pain management: pain level controlled Vital Signs Assessment: post-procedure vital signs reviewed and stable Respiratory status: spontaneous breathing, nonlabored ventilation, respiratory function stable and patient connected to nasal cannula oxygen Cardiovascular status: blood pressure returned to baseline and stable Postop Assessment: no apparent nausea or vomiting Anesthetic complications: no   No notable events documented.  Last Vitals:  Vitals:   08/09/21 1120 08/09/21 1125  BP: 115/88   Pulse: (!) 59 73  Resp: 16 12  Temp: 36.6 C   SpO2: 97% 97%    Last Pain:  Vitals:   08/09/21 1120  TempSrc:   PainSc: 2                  [name]

## 2022-01-12 ENCOUNTER — Ambulatory Visit: Payer: Commercial Managed Care - PPO | Admitting: Certified Nurse Midwife

## 2022-02-09 ENCOUNTER — Encounter: Payer: Self-pay | Admitting: Certified Nurse Midwife

## 2022-02-09 ENCOUNTER — Ambulatory Visit: Payer: Commercial Managed Care - PPO | Admitting: Certified Nurse Midwife

## 2022-02-09 VITALS — BP 140/79 | HR 76 | Wt 153.0 lb

## 2022-02-09 DIAGNOSIS — Z30432 Encounter for removal of intrauterine contraceptive device: Secondary | ICD-10-CM | POA: Diagnosis not present

## 2022-02-10 NOTE — Progress Notes (Signed)
? ? ?  GYNECOLOGY CLINIC PROCEDURE NOTE ?Ms. Heather Richards is a 36 y.o. G1P1001 here for Barlow IUD removal. No GYN concerns.  Last pap smear was on 02/04/2019 and was normal. Will wait until 2025 for repeat pap. ? ?IUD Removal  ?Patient was in the dorsal lithotomy position, normal external genitalia was noted.  A speculum was placed in the patient's vagina, normal discharge was noted, no lesions. The multiparous cervix was visualized, no lesions, no abnormal discharge.  The strings of the IUD were grasped and pulled using ring forceps. The IUD was removed in its entirety.  Patient tolerated the procedure well.   ? ?Patient will use condoms or abstinence for contraception until her husband gets his vasectomy. Routine preventative health maintenance measures emphasized. ? ?Gaylan Gerold, CNM, MSN, IBCLC ?Certified Nurse Midwife, [place] ? ?  ? ?

## 2022-04-19 DIAGNOSIS — F4321 Adjustment disorder with depressed mood: Secondary | ICD-10-CM | POA: Diagnosis not present

## 2022-05-02 DIAGNOSIS — F4321 Adjustment disorder with depressed mood: Secondary | ICD-10-CM | POA: Diagnosis not present

## 2022-05-19 DIAGNOSIS — F4321 Adjustment disorder with depressed mood: Secondary | ICD-10-CM | POA: Diagnosis not present

## 2022-05-30 IMAGING — US US ABDOMEN COMPLETE
1 series · 14 of 25 positions shown · non-contrast
Comparison: None.

CLINICAL DATA: Reflux, left upper quadrant pain for 2 weeks.

EXAM:
ABDOMEN ULTRASOUND COMPLETE

[Series 1: us abdomen complete · 0.20mm/px · 14 of 74 slices shown]
[im 1/74]
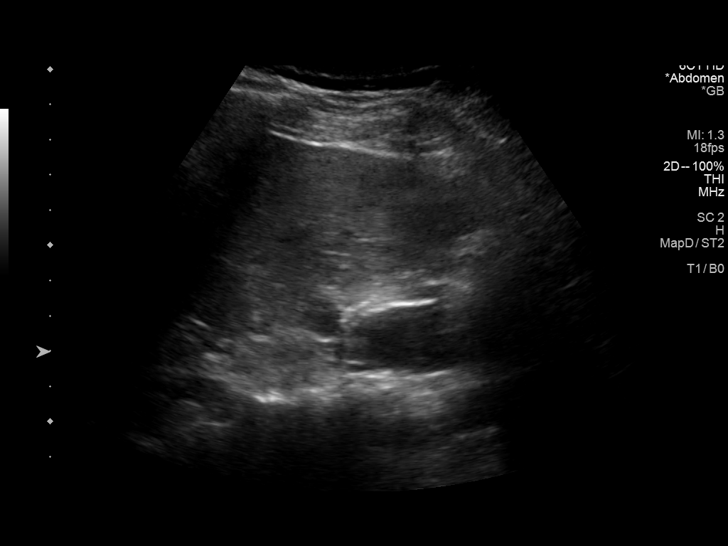
[im 7/74]
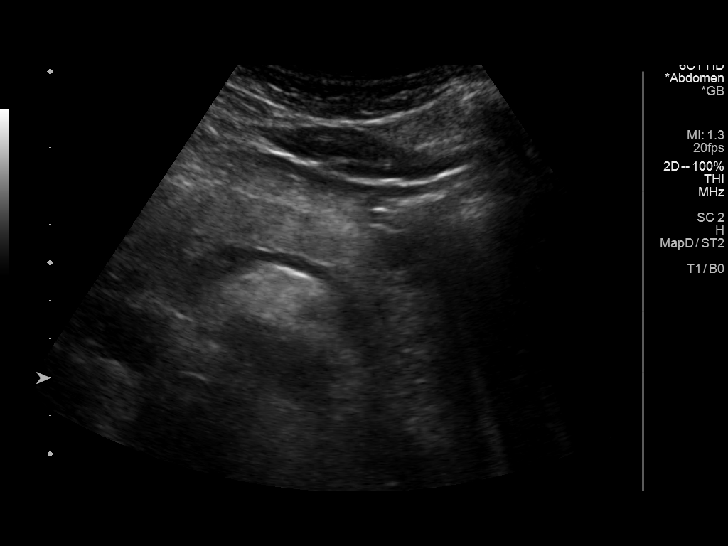
[im 13/74]
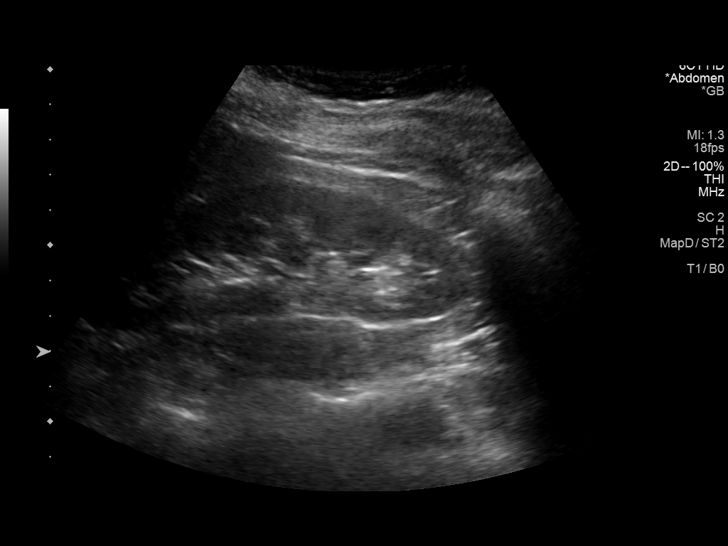
[im 19/74]
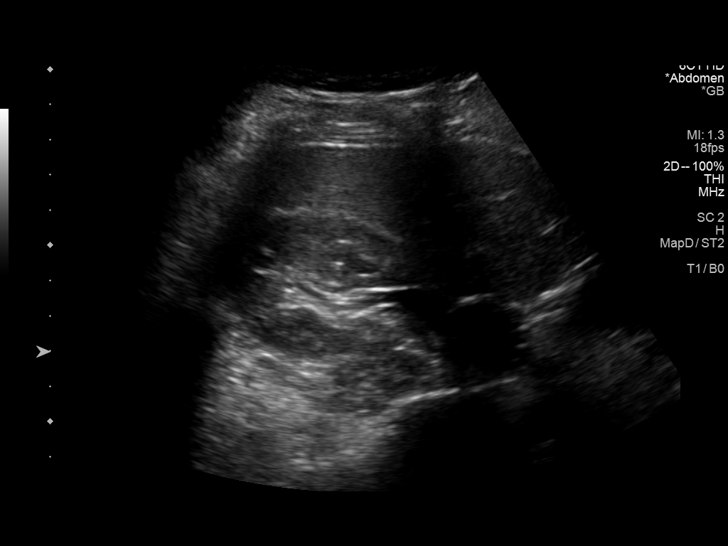
[im 25/74]
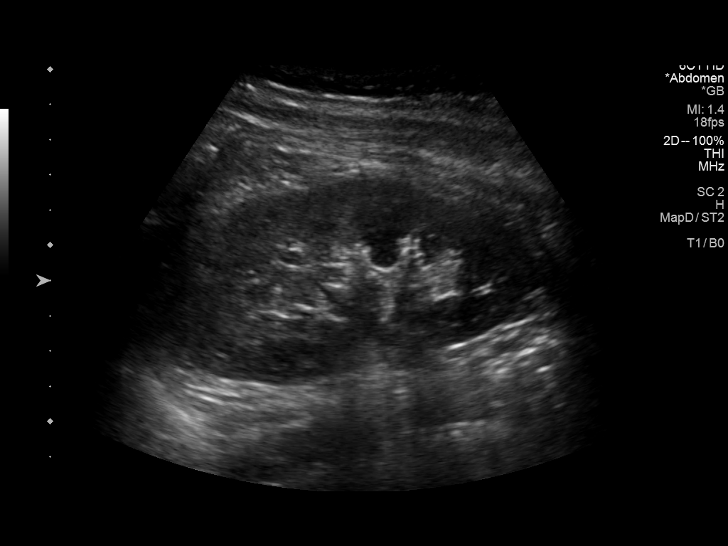
[im 28/74]
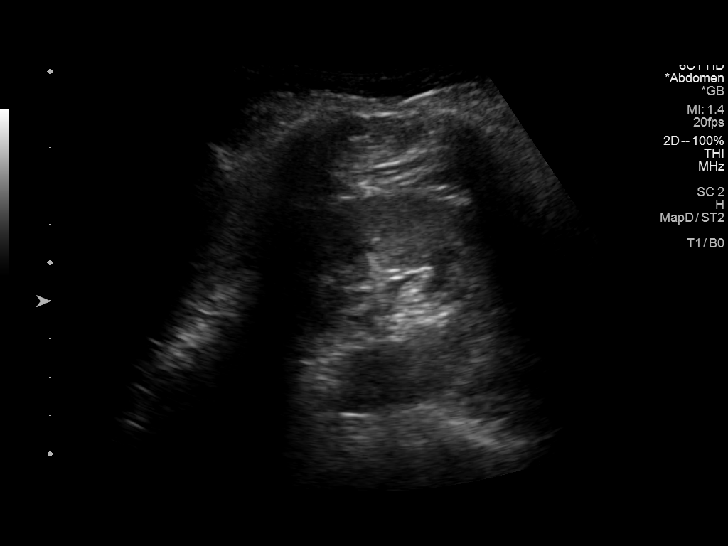
[im 34/74]
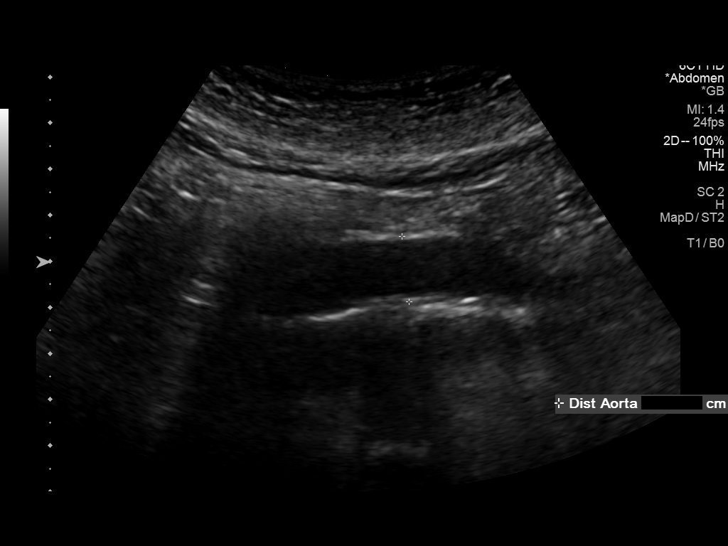
[im 40/74]
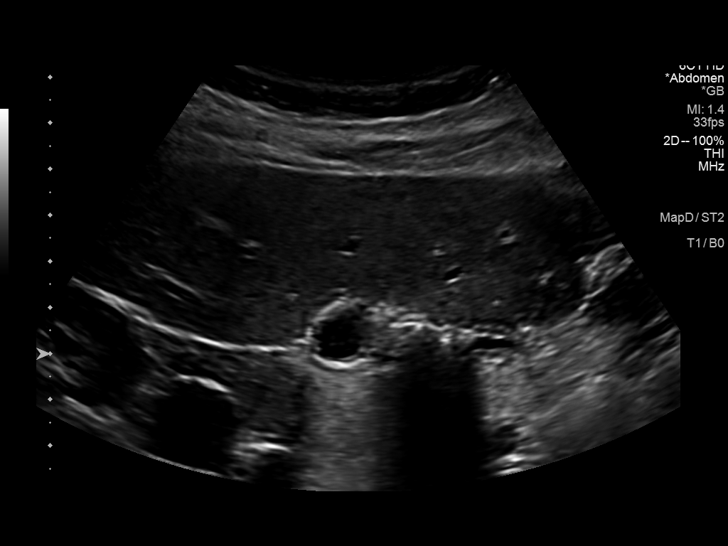
[im 46/74]
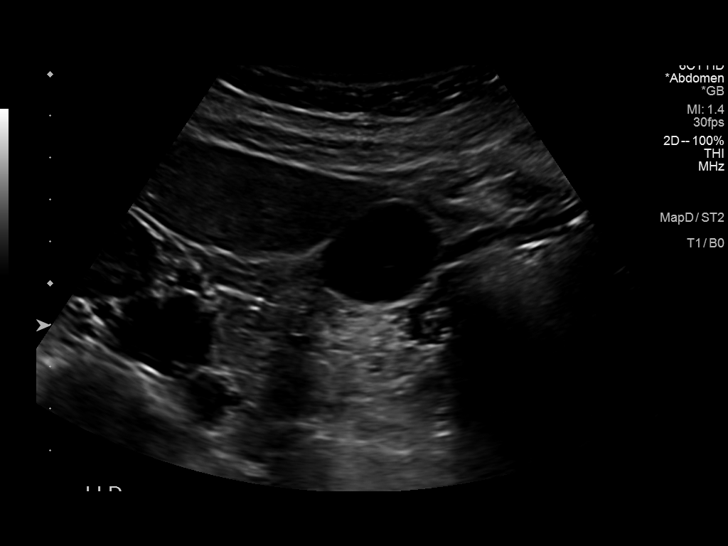
[im 49/74]
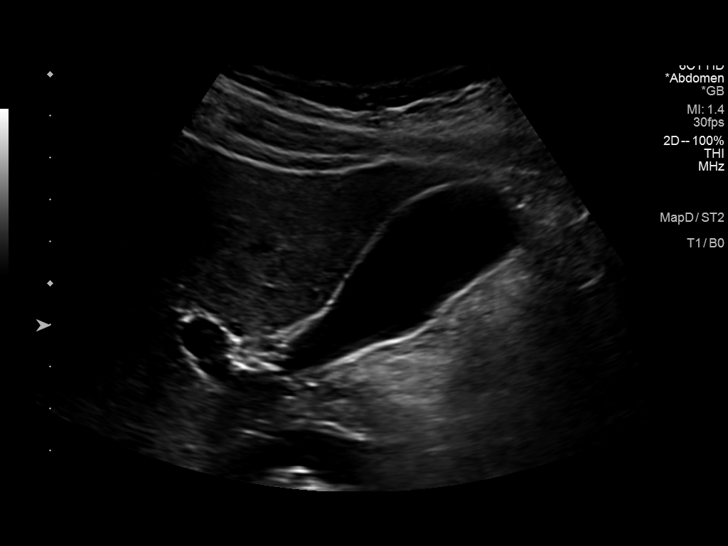
[im 55/74]
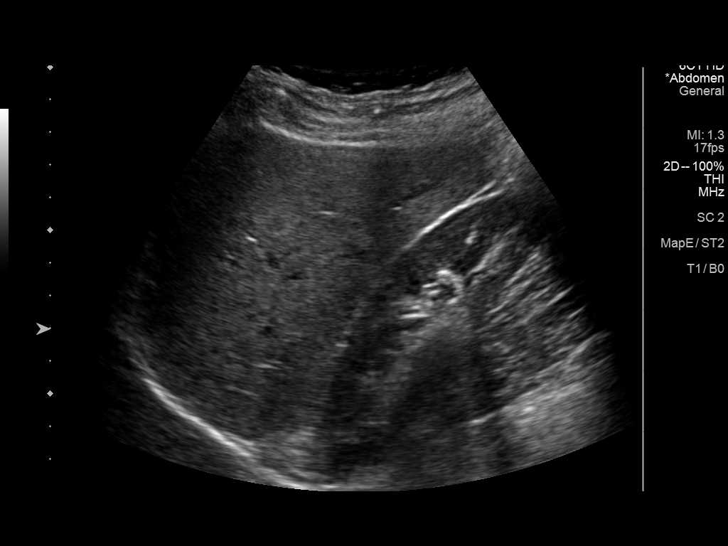
[im 61/74]
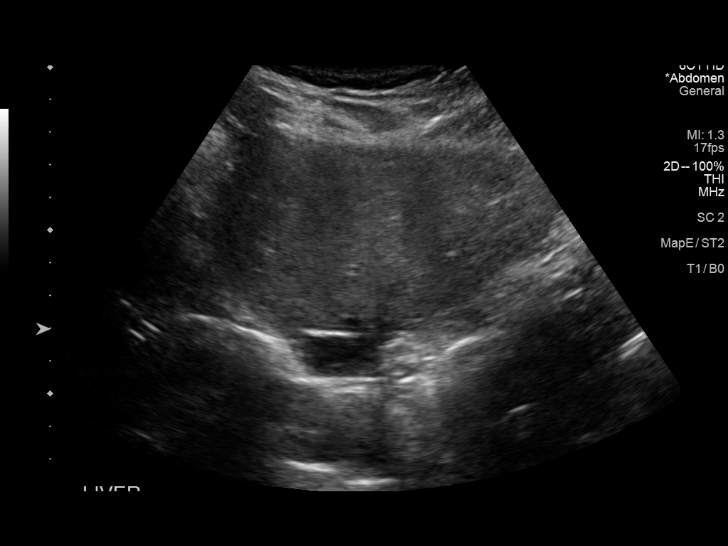
[im 67/74]
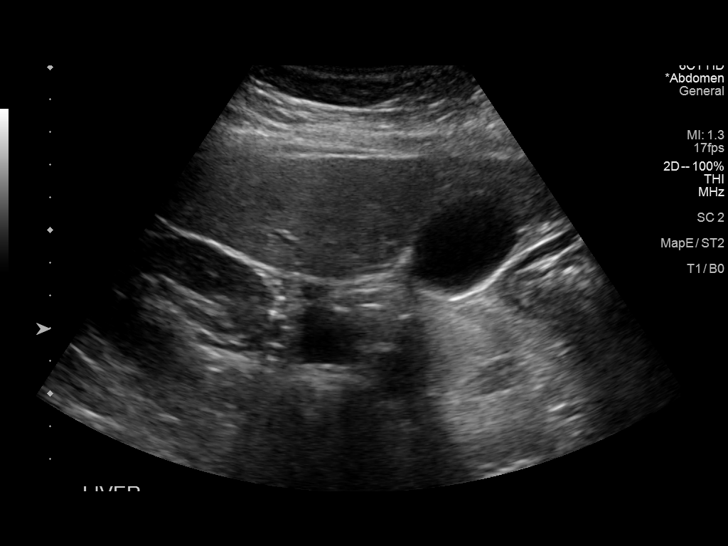
[im 74/74]
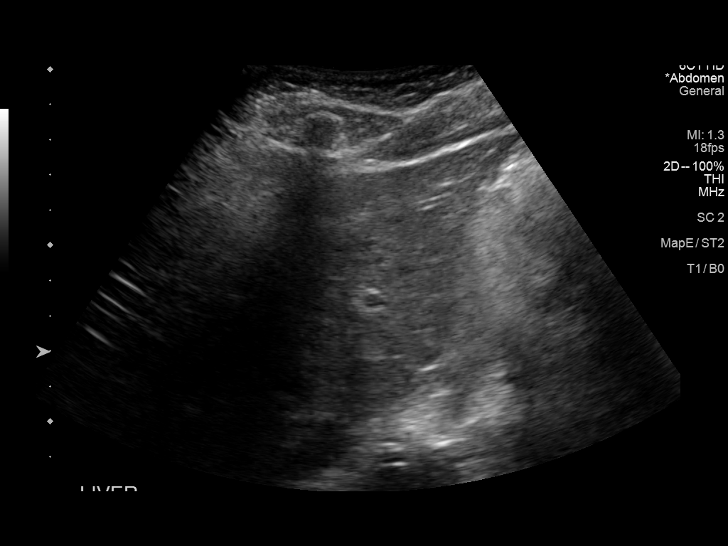

[14 of 25 positions shown; findings below may reference images not displayed]

FINDINGS: Gallbladder: No gallstones or wall thickening visualized. No
sonographic Murphy sign noted by sonographer.

Common bile duct: Diameter: 0.3 cm, within normal limits

Liver: No focal lesion identified. Within normal limits in
parenchymal echogenicity. Portal vein is patent on color Doppler
imaging with normal direction of blood flow towards the liver.

IVC: No abnormality visualized.

Pancreas: Visualized portion unremarkable.

Spleen: Size and appearance within normal limits.

Right Kidney: Length: 11.5 cm. Echogenicity within normal limits. No
mass or hydronephrosis visualized.

Left Kidney: Length: 11.6 cm. Echogenicity within normal limits. No
mass or hydronephrosis visualized.

Abdominal aorta: No aneurysm visualized.

Other findings: None.
IMPRESSION: No sonographic finding to explain the patient's abdominal symptoms.

## 2022-05-31 DIAGNOSIS — F4321 Adjustment disorder with depressed mood: Secondary | ICD-10-CM | POA: Diagnosis not present

## 2022-06-21 DIAGNOSIS — F4321 Adjustment disorder with depressed mood: Secondary | ICD-10-CM | POA: Diagnosis not present

## 2022-07-12 IMAGING — NM NM HEPATO W/GB/PHARM/[PERSON_NAME]
2 series · 12 of 12 positions shown · non-contrast
Comparison: Ultrasound dated 12/16/2020

CLINICAL DATA: Abdominal pain

EXAM:
NUCLEAR MEDICINE HEPATOBILIARY IMAGING WITH GALLBLADDER EF
TECHNIQUE: Sequential images of the abdomen were obtained [DATE] minutes
following intravenous administration of radiopharmaceutical. After
oral ingestion of Ensure, gallbladder ejection fraction was
determined. At 60 min, normal ejection fraction is greater than 33%.
RADIOPHARMACEUTICALS:  5.3 mCi Fc-GGm  Choletec IV

[he hepatobiliary · 4.52mm/px · 6 of 60 frames shown (1 of 2)]
[frame 6/60]
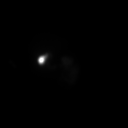
[frame 16/60]
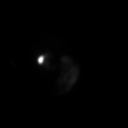
[frame 26/60]
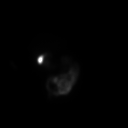
[frame 36/60]
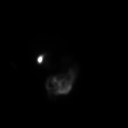
[frame 46/60]
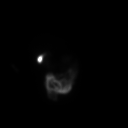
[frame 56/60]
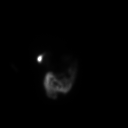

[he hepatobiliary · 4.52mm/px · 6 of 60 frames shown (2 of 2)]
[frame 6/60]
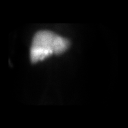
[frame 16/60]
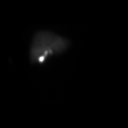
[frame 26/60]
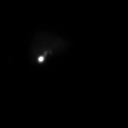
[frame 36/60]
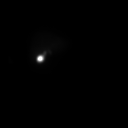
[frame 46/60]
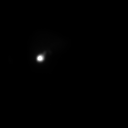
[frame 56/60]
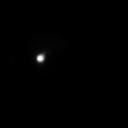

[12 of 12 positions shown; findings below may reference images not displayed]

FINDINGS: Prompt uptake and biliary excretion of activity by the liver is
seen. Gallbladder activity is visualized, consistent with patency of
cystic duct. Biliary activity passes into small bowel, consistent
with patent common bile duct.

Calculated gallbladder ejection fraction is 71%. (Normal gallbladder
ejection fraction with Ensure is greater than 33%.)
IMPRESSION: Unremarkable exam.

## 2022-07-14 DIAGNOSIS — F4321 Adjustment disorder with depressed mood: Secondary | ICD-10-CM | POA: Diagnosis not present

## 2022-08-01 DIAGNOSIS — Z13220 Encounter for screening for lipoid disorders: Secondary | ICD-10-CM | POA: Diagnosis not present

## 2022-08-01 DIAGNOSIS — F330 Major depressive disorder, recurrent, mild: Secondary | ICD-10-CM | POA: Diagnosis not present

## 2022-08-01 DIAGNOSIS — Z0000 Encounter for general adult medical examination without abnormal findings: Secondary | ICD-10-CM | POA: Diagnosis not present

## 2022-08-01 DIAGNOSIS — E039 Hypothyroidism, unspecified: Secondary | ICD-10-CM | POA: Diagnosis not present

## 2022-08-01 DIAGNOSIS — D508 Other iron deficiency anemias: Secondary | ICD-10-CM | POA: Diagnosis not present

## 2022-08-01 DIAGNOSIS — E063 Autoimmune thyroiditis: Secondary | ICD-10-CM | POA: Diagnosis not present

## 2022-08-02 DIAGNOSIS — F4321 Adjustment disorder with depressed mood: Secondary | ICD-10-CM | POA: Diagnosis not present

## 2022-08-19 DIAGNOSIS — E063 Autoimmune thyroiditis: Secondary | ICD-10-CM | POA: Diagnosis not present

## 2022-08-19 DIAGNOSIS — E538 Deficiency of other specified B group vitamins: Secondary | ICD-10-CM | POA: Diagnosis not present

## 2022-08-19 DIAGNOSIS — Z8639 Personal history of other endocrine, nutritional and metabolic disease: Secondary | ICD-10-CM | POA: Diagnosis not present

## 2022-08-19 DIAGNOSIS — F320 Major depressive disorder, single episode, mild: Secondary | ICD-10-CM | POA: Diagnosis not present

## 2022-08-22 DIAGNOSIS — F4321 Adjustment disorder with depressed mood: Secondary | ICD-10-CM | POA: Diagnosis not present

## 2022-09-09 DIAGNOSIS — E538 Deficiency of other specified B group vitamins: Secondary | ICD-10-CM | POA: Diagnosis not present

## 2022-09-09 DIAGNOSIS — F320 Major depressive disorder, single episode, mild: Secondary | ICD-10-CM | POA: Diagnosis not present

## 2022-09-09 DIAGNOSIS — E063 Autoimmune thyroiditis: Secondary | ICD-10-CM | POA: Diagnosis not present

## 2022-09-09 DIAGNOSIS — Z8639 Personal history of other endocrine, nutritional and metabolic disease: Secondary | ICD-10-CM | POA: Diagnosis not present

## 2022-09-13 DIAGNOSIS — F4321 Adjustment disorder with depressed mood: Secondary | ICD-10-CM | POA: Diagnosis not present

## 2022-10-04 DIAGNOSIS — F4321 Adjustment disorder with depressed mood: Secondary | ICD-10-CM | POA: Diagnosis not present

## 2022-10-26 DIAGNOSIS — F4321 Adjustment disorder with depressed mood: Secondary | ICD-10-CM | POA: Diagnosis not present

## 2022-11-16 DIAGNOSIS — F4321 Adjustment disorder with depressed mood: Secondary | ICD-10-CM | POA: Diagnosis not present

## 2022-11-17 DIAGNOSIS — F320 Major depressive disorder, single episode, mild: Secondary | ICD-10-CM | POA: Diagnosis not present

## 2022-11-17 DIAGNOSIS — Z8639 Personal history of other endocrine, nutritional and metabolic disease: Secondary | ICD-10-CM | POA: Diagnosis not present

## 2022-11-17 DIAGNOSIS — Z862 Personal history of diseases of the blood and blood-forming organs and certain disorders involving the immune mechanism: Secondary | ICD-10-CM | POA: Diagnosis not present

## 2022-11-17 DIAGNOSIS — E063 Autoimmune thyroiditis: Secondary | ICD-10-CM | POA: Diagnosis not present

## 2022-12-13 DIAGNOSIS — F4321 Adjustment disorder with depressed mood: Secondary | ICD-10-CM | POA: Diagnosis not present

## 2022-12-28 DIAGNOSIS — R051 Acute cough: Secondary | ICD-10-CM | POA: Diagnosis not present

## 2022-12-28 DIAGNOSIS — J029 Acute pharyngitis, unspecified: Secondary | ICD-10-CM | POA: Diagnosis not present

## 2022-12-28 DIAGNOSIS — R519 Headache, unspecified: Secondary | ICD-10-CM | POA: Diagnosis not present

## 2022-12-28 DIAGNOSIS — Z20828 Contact with and (suspected) exposure to other viral communicable diseases: Secondary | ICD-10-CM | POA: Diagnosis not present

## 2023-01-10 DIAGNOSIS — F4321 Adjustment disorder with depressed mood: Secondary | ICD-10-CM | POA: Diagnosis not present

## 2023-02-14 DIAGNOSIS — Z0000 Encounter for general adult medical examination without abnormal findings: Secondary | ICD-10-CM | POA: Diagnosis not present

## 2023-02-14 DIAGNOSIS — E538 Deficiency of other specified B group vitamins: Secondary | ICD-10-CM | POA: Diagnosis not present

## 2023-02-14 DIAGNOSIS — E063 Autoimmune thyroiditis: Secondary | ICD-10-CM | POA: Diagnosis not present

## 2023-02-14 DIAGNOSIS — Z8639 Personal history of other endocrine, nutritional and metabolic disease: Secondary | ICD-10-CM | POA: Diagnosis not present

## 2023-02-14 DIAGNOSIS — F320 Major depressive disorder, single episode, mild: Secondary | ICD-10-CM | POA: Diagnosis not present

## 2023-02-14 DIAGNOSIS — Z13220 Encounter for screening for lipoid disorders: Secondary | ICD-10-CM | POA: Diagnosis not present

## 2023-02-21 DIAGNOSIS — F4321 Adjustment disorder with depressed mood: Secondary | ICD-10-CM | POA: Diagnosis not present

## 2023-03-22 DIAGNOSIS — F4321 Adjustment disorder with depressed mood: Secondary | ICD-10-CM | POA: Diagnosis not present

## 2023-04-07 DIAGNOSIS — E063 Autoimmune thyroiditis: Secondary | ICD-10-CM | POA: Diagnosis not present

## 2023-04-07 DIAGNOSIS — Z8639 Personal history of other endocrine, nutritional and metabolic disease: Secondary | ICD-10-CM | POA: Diagnosis not present

## 2023-04-07 DIAGNOSIS — Z713 Dietary counseling and surveillance: Secondary | ICD-10-CM | POA: Diagnosis not present

## 2023-05-03 DIAGNOSIS — F4321 Adjustment disorder with depressed mood: Secondary | ICD-10-CM | POA: Diagnosis not present

## 2023-05-15 DIAGNOSIS — J029 Acute pharyngitis, unspecified: Secondary | ICD-10-CM | POA: Diagnosis not present

## 2023-05-22 DIAGNOSIS — F4321 Adjustment disorder with depressed mood: Secondary | ICD-10-CM | POA: Diagnosis not present

## 2023-05-24 DIAGNOSIS — J020 Streptococcal pharyngitis: Secondary | ICD-10-CM | POA: Diagnosis not present

## 2023-06-09 ENCOUNTER — Other Ambulatory Visit: Payer: Self-pay

## 2023-06-09 ENCOUNTER — Other Ambulatory Visit (HOSPITAL_COMMUNITY)
Admission: RE | Admit: 2023-06-09 | Discharge: 2023-06-09 | Disposition: A | Discharge status: Home / Self Care | Payer: BC Managed Care – PPO | Source: Ambulatory Visit | Attending: Obstetrics and Gynecology | Admitting: Obstetrics and Gynecology

## 2023-06-09 ENCOUNTER — Encounter: Payer: Self-pay | Admitting: Obstetrics and Gynecology

## 2023-06-09 ENCOUNTER — Ambulatory Visit (INDEPENDENT_AMBULATORY_CARE_PROVIDER_SITE_OTHER): Payer: BC Managed Care – PPO | Admitting: Obstetrics and Gynecology

## 2023-06-09 VITALS — BP 124/80 | HR 83 | Ht 64.0 in | Wt 166.1 lb

## 2023-06-09 DIAGNOSIS — N39491 Coital incontinence: Secondary | ICD-10-CM | POA: Diagnosis not present

## 2023-06-09 DIAGNOSIS — B379 Candidiasis, unspecified: Secondary | ICD-10-CM

## 2023-06-09 DIAGNOSIS — R87610 Atypical squamous cells of undetermined significance on cytologic smear of cervix (ASC-US): Secondary | ICD-10-CM | POA: Diagnosis not present

## 2023-06-09 DIAGNOSIS — Z01419 Encounter for gynecological examination (general) (routine) without abnormal findings: Secondary | ICD-10-CM | POA: Diagnosis not present

## 2023-06-09 NOTE — Progress Notes (Signed)
ANNUAL EXAM Patient name: Heather Richards MRN [id number]  Date of birth: Apr 27, 1986 Chief Complaint:   Gynecologic Exam  History of Present Illness:   Heather Richards is a 37 y.o. G25P1001  female being seen today for a routine annual exam.  Current complaints: coital incontinence with intercourse, also complains of leaking some with activity, and unable to empty all the way. Has been going on the past couple of months, thought it could be related to starting prozac, but she has been off of prozac for some time and still having incontinence. Denies vaginal pressure or fullness.   Patient's last menstrual period was 05/31/2023 (approximate).   The pregnancy intention screening data noted above was reviewed. Potential methods of contraception were discussed. The patient elected to proceed with husband vasectomy  Last pap 02/04/19. Results were: NILM w/ HRHPV negative. H/O abnormal pap: no Last mammogram: n/a. Results were: N/A. Family h/o breast cancer: no Last colonoscopy: n/a. Results were: N/A. Family h/o colorectal cancer: yes maternal aunt     02/09/2022    4:14 PM 06/04/2021    5:08 PM 03/31/2021    3:09 PM  Depression screen PHQ 2/9  Decreased Interest 1 0 0  Down, Depressed, Hopeless 1 0 0  PHQ - 2 Score 2 0 0  Altered sleeping 2 1 0  Tired, decreased energy 2 1 3   Change in appetite 0 0 1  Feeling bad or failure about yourself  0 0 0  Trouble concentrating 0 0 0  Moving slowly or fidgety/restless 0 0 0  Suicidal thoughts 0 0 0  PHQ-9 Score 6 2 4   Difficult doing work/chores   Not difficult at all        02/09/2022    4:14 PM 06/04/2021    5:09 PM 03/31/2021    3:09 PM  GAD 7 : Generalized Anxiety Score  Nervous, Anxious, on Edge 0 0 1  Control/stop worrying 0 1 0  Worry too much - different things 1 0 1  Trouble relaxing 0 0 0  Restless 0 0 0  Easily annoyed or irritable 2 1 1   Afraid - awful might happen 0 0 0  Total GAD 7 Score 3 2 3      Review of Systems:    Pertinent items are noted in HPI Denies any headaches, blurred vision, fatigue, shortness of breath, chest pain, abdominal pain, abnormal vaginal discharge/itching/odor/irritation, problems with periods, bowel movements, urination, or intercourse unless otherwise stated above. Pertinent History Reviewed:  Reviewed past medical,surgical, social and family history.  Reviewed problem list, medications and allergies. Physical Assessment:   Vitals:   06/09/23 0846  BP: 124/80  Pulse: 83  Weight: 166 lb 1.6 oz (75.3 kg)  Height: 5\' 4"  (1.626 m)  Body mass index is 28.51 kg/m.        Physical Examination:   General appearance - well appearing, and in no distress  Mental status - alert, oriented to person, place, and time  Psych:  She has a normal mood and affect  Skin - warm and dry, normal color, no suspicious lesions noted  Chest - effort normal, all lung fields clear to auscultation bilaterally  Heart - normal rate and regular rhythm  Neck:  midline trachea, no thyromegaly or nodules  Abdomen - soft, nontender, nondistended, no masses or organomegaly  Pelvic - VULVA: normal appearing vulva with no masses, tenderness or lesions  VAGINA: normal appearing vagina with normal color and discharge, no lesions  CERVIX: normal  appearing cervix without discharge or lesions, no CMT  Thin prep pap is done with HR HPV cotesting  UTERUS: uterus is felt to be normal size, shape, consistency and nontender   ADNEXA: No adnexal masses or tenderness noted.    Extremities:  No swelling or varicosities noted  Chaperone present for exam  No results found for this or any previous visit (from the past 24 hour(s)).  Assessment & Plan:  1. Well woman exam with routine gynecological exam Continue self breast exams at home  - Cytology - PAP( [place]) - Cervicovaginal ancillary only( [place])  2. Coital urinary incontinence Swab and UA today to rule out infectious cause. Discussed trial of pelvic  floor PT, follow up if symptoms worsening or not resolving.   - Ambulatory referral to Physical Therapy - Cervicovaginal ancillary only( [place])    Labs/procedures today:   Mammogram: @ 35yo, or sooner if problems Colonoscopy: @ 35yo, or sooner if problems  Orders Placed This Encounter  Procedures   Ambulatory referral to Physical Therapy    Meds: No orders of the defined types were placed in this encounter.   Follow-up: Return in about 1 year (around 06/08/2024) for Thayer Jew, FNP

## 2023-06-09 NOTE — Progress Notes (Signed)
Pt reports husband has Vasectomy , so she got her IUD removed. Pt also reports a little bladder incontinence & every time that she orgasm she pees.

## 2023-06-12 LAB — CERVICOVAGINAL ANCILLARY ONLY
Bacterial Vaginitis (gardnerella): NEGATIVE
Candida Glabrata: POSITIVE — AB
Candida Vaginitis: NEGATIVE
Comment: NEGATIVE
Comment: NEGATIVE
Comment: NEGATIVE

## 2023-06-13 ENCOUNTER — Telehealth: Payer: Self-pay | Admitting: *Deleted

## 2023-06-13 DIAGNOSIS — B379 Candidiasis, unspecified: Secondary | ICD-10-CM

## 2023-06-13 DIAGNOSIS — F4321 Adjustment disorder with depressed mood: Secondary | ICD-10-CM | POA: Diagnosis not present

## 2023-06-13 MED ORDER — AMPHOTERICIN B POWD: 5.0000 g | Freq: Every day | 0 refills | Status: DC

## 2023-06-13 MED ORDER — BORIC ACID CRYS: 600.0000 mg | CRYSTALS | Freq: Every day | 2 refills | Status: AC

## 2023-06-13 NOTE — Telephone Encounter (Signed)
Patient called after hours nurse and call transferred to our office. Heydi states she had a prescription sent in by Albertine Grates, FNP for candida and it is a medication that has not worked in the past. She wanted to know if she would send in RX for a medication that has worked in the past called Amphotericin. She said the one sent to [facility] was fine. I  informed her I will review with provider and send in. If not approved will notifiy her. She voices understanding. I reviewed with Sharyn Creamer and RX approved and sent to [facility]. Nancy Fetter

## 2023-06-13 NOTE — Addendum Note (Signed)
Addended by: Sue Lush on: 06/13/2023 01:36 PM   Modules accepted: Orders

## 2023-06-14 LAB — CYTOLOGY - PAP
Comment: NEGATIVE
Diagnosis: UNDETERMINED — AB
High risk HPV: NEGATIVE

## 2023-06-15 ENCOUNTER — Telehealth: Payer: Self-pay | Admitting: *Deleted

## 2023-06-15 ENCOUNTER — Encounter: Payer: Self-pay | Admitting: *Deleted

## 2023-06-15 DIAGNOSIS — B379 Candidiasis, unspecified: Secondary | ICD-10-CM

## 2023-06-15 MED ORDER — AMPHOTERICIN B POWD: 5.0000 g | Freq: Every day | 0 refills | Status: AC

## 2023-06-15 MED ORDER — AMPHOTERICIN B POWD: 5.0000 g | Freq: Every day | 0 refills | Status: DC

## 2023-06-15 NOTE — Telephone Encounter (Addendum)
VM left on nurse line stating [facility] did not receive prescription. Called [facility] to follow up. They did not receive prescription for Amphotericin B although EPIC does show receipt confirmed by pharmacy on 8/20. Pharmacy asks that we resend and callback this afternoon to follow up.

## 2023-06-15 NOTE — Telephone Encounter (Signed)
Received a voicemail from [facility] that they did find the prescription but that they cannot compound the Amphotericin B. They suggested can try [facility]. They also stated patient is aware.  Will send RX to [facility] and send message to patient. Nancy Fetter

## 2023-06-15 NOTE — Addendum Note (Signed)
Addended by: Marjo Bicker on: 06/15/2023 11:24 AM   Modules accepted: Orders

## 2023-06-30 DIAGNOSIS — E063 Autoimmune thyroiditis: Secondary | ICD-10-CM | POA: Diagnosis not present

## 2023-06-30 DIAGNOSIS — Z713 Dietary counseling and surveillance: Secondary | ICD-10-CM | POA: Diagnosis not present

## 2023-06-30 DIAGNOSIS — Z8639 Personal history of other endocrine, nutritional and metabolic disease: Secondary | ICD-10-CM | POA: Diagnosis not present

## 2023-07-04 DIAGNOSIS — F4321 Adjustment disorder with depressed mood: Secondary | ICD-10-CM | POA: Diagnosis not present

## 2023-07-13 DIAGNOSIS — F320 Major depressive disorder, single episode, mild: Secondary | ICD-10-CM | POA: Diagnosis not present

## 2023-07-27 DIAGNOSIS — F4321 Adjustment disorder with depressed mood: Secondary | ICD-10-CM | POA: Diagnosis not present

## 2023-08-01 DIAGNOSIS — D181 Lymphangioma, any site: Secondary | ICD-10-CM | POA: Diagnosis not present

## 2023-08-01 DIAGNOSIS — L818 Other specified disorders of pigmentation: Secondary | ICD-10-CM | POA: Diagnosis not present

## 2023-08-07 NOTE — Therapy (Unsigned)
OUTPATIENT PHYSICAL THERAPY FEMALE PELVIC EVALUATION   Patient Name: Heather Richards MRN: [id number] DOB:24-Oct-1986, 37 y.o., female Today's Date: 08/08/2023  END OF SESSION:  PT End of Session - 08/08/23 0839     Visit Number 1    Date for PT Re-Evaluation 02/06/24    Authorization Type BCBS    Authorization - Visit Number 1    Authorization - Number of Visits 30    PT Start Time 0830    PT Stop Time 0915    PT Time Calculation (min) 45 min    Activity Tolerance Patient tolerated treatment well    Behavior During Therapy [facility] for tasks assessed/performed             Past Medical History:  Diagnosis Date   Allergy    Anemia 2017   ASCUS (atypical squamous cells of undetermined significance) on Pap smear 12/2011   hpv not detected   History of endoscopy 01/2021   Hx of migraines    Hypothyroidism 2017   Normal vaginal delivery 01/21/2016   Rh negative status during pregnancy 01/19/2016   Rubella non-immune status, antepartum 01/19/2016   Thyroid disease    Vaginal Pap smear, abnormal    Past Surgical History:  Procedure Laterality Date   BREAST REDUCTION SURGERY Bilateral 08/09/2021   Procedure: MAMMARY REDUCTION  (BREAST);  Surgeon: Glenna Fellows, MD;  Location: [facility] OR;  Service: Plastics;  Laterality: Bilateral;   EYE SURGERY Bilateral 2013   lasik   WISDOM TOOTH EXTRACTION     There are no problems to display for this patient.   PCP: Merlyn Lot, PA  REFERRING PROVIDER: Sue Lush, FNP   REFERRING DIAG: [phone] (ICD-10-CM) - Coital urinary incontinence  THERAPY DIAG:  Cramp and spasm  Other lack of coordination  Rationale for Evaluation and Treatment: Rehabilitation  ONSET DATE: 2017  SUBJECTIVE:                                                                                                                                                                                           SUBJECTIVE STATEMENT: Issues started when she had her  children. Patient has started to do some exercises on her own. I have been trying depression medications with my MD. I started to have more trouble emptying my bladder and orgasm she would urinate. She is off the depression medication and still having the issues.  Fluid intake: Yes: water, hot tea, coffee, seltzer    PAIN:  Are you having pain? No  PRECAUTIONS: None  RED FLAGS: None   WEIGHT BEARING RESTRICTIONS: No  FALLS:  Has patient fallen in  last 6 months? No  LIVING ENVIRONMENT: Lives with: lives with their family  OCCUPATION: sitting job, standing desk, under desk treadmill  PLOF: Independent  PATIENT GOALS: reduce leakage, empty bladder fully   PERTINENT HISTORY:  Hypothyroidism Sexual abuse: No  BOWEL MOVEMENT: no issues  URINATION: Pain with urination: No Fully empty bladder: No, after urination still feels like she has to urinate and not able to get anymore out Stream:  average, sometimes may go more to the right Urgency: Yes: sometimes Frequency: every several hours Leakage:  coital incontinence with intercourse, leaking with activity , if waits too long Pads: No  INTERCOURSE: Pain with intercourse: Initial Penetration, feels like cervix in the way at first; pain level 4/10 Ability to have vaginal penetration:  Yes:   Climax: yes Marinoff Scale: 1/3  PREGNANCY: Vaginal deliveries 1, in labor for 48 hours Tearing Yes: 2nd degree tear   PROLAPSE: None   OBJECTIVE:  Note: Objective measures were completed at Evaluation unless otherwise noted.  DIAGNOSTIC FINDINGS:  none   COGNITION: Overall cognitive status: Within functional limits for tasks assessed     SENSATION: Light touch: Appears intact Proprioception: Appears intact   PELVIC ALIGNMENT: ASIS are equal  LUMBARAROM/PROM: lumbar ROM is full   LOWER EXTREMITY ROM: full hip ROM   LOWER EXTREMITY MMT: bilateral hip strength is 5/5   PALPATION:   General  contract the abdomen  and bulge the lower abdomen.                 External Perineal Exam tenderness located on the right side of the clitoris, along the perineal body, restrictions in the perineal body                             Internal Pelvic Floor tenderness located in the iliococcygeus bil., left obturator internist, posterior vaginal canal, restrictions on the right ovary, left superior anterior vaginal canal  Patient confirms identification and approves PT to assess internal pelvic floor and treatment Yes  PELVIC MMT:   MMT eval  Vaginal 3/5  Diastasis Recti none  (Blank rows = not tested)        TONE: increased  PROLAPSE: Anterior wall weakness   TODAY'S TREATMENT:                                                                                                                              DATE: 08/08/23  EVAL See below   PATIENT EDUCATION:  Education details: educated patient on performing manual work to the clitoris and perineal body to reduce restrictions Person educated: Patient Education method: Programmer, multimedia, Facilities manager, Actor cues, and Verbal cues Education comprehension: verbalized understanding, returned demonstration, verbal cues required, and tactile cues required  HOME EXERCISE PROGRAM: See above  ASSESSMENT:  CLINICAL IMPRESSION: Patient is a 37 y.o. female who was seen today for physical therapy evaluation and treatment for clitoral incontinence. Patient reports  she has had issues with incontinence for 7 years after she had her son with a second degree tear. She reports urinary leakage with orgasm,  activity and if she waits too long. She will urinate and still feel the need to urinate. She will feel discomfort with penile penetration vaginally. Pelvic floor strength is 3/5 but not good hug of therapist finger. She has tenderness location the right side of the clitoris, perineal body, bilateral iliococcygeus, right ovary and left anterior vaginal canal. Patient will benefit  from skilled therapy to improve pelvic floor coordination and reduce tension to improve continence.   OBJECTIVE IMPAIRMENTS: decreased activity tolerance, decreased coordination, decreased strength, increased fascial restrictions, and pain.   ACTIVITY LIMITATIONS: continence  PARTICIPATION LIMITATIONS: interpersonal relationship  PERSONAL FACTORS: Time since onset of injury/illness/exacerbation are also affecting patient's functional outcome.   REHAB POTENTIAL: Excellent  CLINICAL DECISION MAKING: Stable/uncomplicated  EVALUATION COMPLEXITY: Low   GOALS: Goals reviewed with patient? Yes  SHORT TERM GOALS: Target date: 11/24  Patient independent with manual work to the perineal area to reduce restrictions.  Baseline: Goal status: INITIAL  2.  Patient is able to contract the lower abdominals equally with the upper.  Baseline:  Goal status: INITIAL  3.  Patient reports less tenderness around the perineal body and clitoris.  Baseline:  Goal status: INITIAL   LONG TERM GOALS: Target date: 02/06/24  Patient independent with advanced HEP for core and pelvic floor.  Baseline:  Goal status: INITIAL  2.  Patient is able to have penile penetration vaginally without pain due to reduction of fascial restrictions and trigger points.  Baseline:  Goal status: INITIAL  3.  Patient is able to climax without urinary leakage due to reduction of restrictions.  Baseline:  Goal status: INITIAL  4.  Patient reports overall reduction of urinary leakage >/= 80% due to improved control of the pelvic floor.  Baseline:  Goal status: INITIAL   PLAN:  PT FREQUENCY: 1x/week  PT DURATION: 6 months  PLANNED INTERVENTIONS: 97110-Therapeutic exercises, 97530- Therapeutic activity, 97112- Neuromuscular re-education, 97140- Manual therapy, 97035- Ultrasound, Patient/Family education, Dry Needling, Scar mobilization, Cryotherapy, Moist heat, and Biofeedback  PLAN FOR NEXT SESSION: manual work  to lower abdomen, work on contraction, hip stretches for pelvic floor, manual work to the pelvic floor   Eulis Foster, PT 08/08/23 9:30 AM

## 2023-08-08 ENCOUNTER — Encounter: Payer: Self-pay | Admitting: Physical Therapy

## 2023-08-08 ENCOUNTER — Encounter: Payer: BC Managed Care – PPO | Attending: Obstetrics and Gynecology | Admitting: Physical Therapy

## 2023-08-08 ENCOUNTER — Other Ambulatory Visit: Payer: Self-pay

## 2023-08-08 DIAGNOSIS — R252 Cramp and spasm: Secondary | ICD-10-CM | POA: Diagnosis not present

## 2023-08-08 DIAGNOSIS — R278 Other lack of coordination: Secondary | ICD-10-CM | POA: Diagnosis not present

## 2023-08-08 DIAGNOSIS — N39491 Coital incontinence: Secondary | ICD-10-CM | POA: Diagnosis not present

## 2023-08-15 ENCOUNTER — Encounter: Payer: BC Managed Care – PPO | Admitting: Physical Therapy

## 2023-08-15 ENCOUNTER — Encounter: Payer: Self-pay | Admitting: Physical Therapy

## 2023-08-15 DIAGNOSIS — R252 Cramp and spasm: Secondary | ICD-10-CM | POA: Diagnosis not present

## 2023-08-15 DIAGNOSIS — R278 Other lack of coordination: Secondary | ICD-10-CM

## 2023-08-15 DIAGNOSIS — N39491 Coital incontinence: Secondary | ICD-10-CM | POA: Diagnosis not present

## 2023-08-15 NOTE — Therapy (Signed)
OUTPATIENT PHYSICAL THERAPY FEMALE PELVIC TREATMENT   Patient Name: Heather Richards MRN: [id number] DOB:08-07-1986, 37 y.o., female Today's Date: 08/15/2023  END OF SESSION:  PT End of Session - 08/15/23 0935     Visit Number 2    Date for PT Re-Evaluation 02/06/24    Authorization Type BCBS    Authorization - Visit Number 2    Authorization - Number of Visits 30    PT Start Time 0930    PT Stop Time 1015    PT Time Calculation (min) 45 min    Activity Tolerance Patient tolerated treatment well    Behavior During Therapy [facility] for tasks assessed/performed             Past Medical History:  Diagnosis Date   Allergy    Anemia 2017   ASCUS (atypical squamous cells of undetermined significance) on Pap smear 12/2011   hpv not detected   History of endoscopy 01/2021   Hx of migraines    Hypothyroidism 2017   Normal vaginal delivery 01/21/2016   Rh negative status during pregnancy 01/19/2016   Rubella non-immune status, antepartum 01/19/2016   Thyroid disease    Vaginal Pap smear, abnormal    Past Surgical History:  Procedure Laterality Date   BREAST REDUCTION SURGERY Bilateral 08/09/2021   Procedure: MAMMARY REDUCTION  (BREAST);  Surgeon: Glenna Fellows, MD;  Location: [facility] OR;  Service: Plastics;  Laterality: Bilateral;   EYE SURGERY Bilateral 2013   lasik   WISDOM TOOTH EXTRACTION     There are no problems to display for this patient.   PCP: Merlyn Lot, PA  REFERRING PROVIDER: Sue Lush, FNP   REFERRING DIAG: [phone] (ICD-10-CM) - Coital urinary incontinence  THERAPY DIAG:  Cramp and spasm  Other lack of coordination  Rationale for Evaluation and Treatment: Rehabilitation  ONSET DATE: 2017  SUBJECTIVE:                                                                                                                                                                                           SUBJECTIVE STATEMENT: I have done my program.   Fluid  intake: Yes: water, hot tea, coffee, seltzer    PAIN:  Are you having pain? No  PRECAUTIONS: None  RED FLAGS: None   WEIGHT BEARING RESTRICTIONS: No  FALLS:  Has patient fallen in last 6 months? No  LIVING ENVIRONMENT: Lives with: lives with their family  OCCUPATION: sitting job, standing desk, under desk treadmill  PLOF: Independent  PATIENT GOALS: reduce leakage, empty bladder fully   PERTINENT HISTORY:  Hypothyroidism Sexual abuse: No  BOWEL MOVEMENT:  no issues  URINATION: Pain with urination: No Fully empty bladder: No, after urination still feels like she has to urinate and not able to get anymore out Stream:  average, sometimes may go more to the right Urgency: Yes: sometimes Frequency: every several hours Leakage:  coital incontinence with intercourse, leaking with activity , if waits too long Pads: No  INTERCOURSE: Pain with intercourse: Initial Penetration, feels like cervix in the way at first; pain level 4/10 Ability to have vaginal penetration:  Yes:   Climax: yes Marinoff Scale: 1/3  PREGNANCY: Vaginal deliveries 1, in labor for 48 hours Tearing Yes: 2nd degree tear   PROLAPSE: None   OBJECTIVE:  Note: Objective measures were completed at Evaluation unless otherwise noted.  DIAGNOSTIC FINDINGS:  none   COGNITION: Overall cognitive status: Within functional limits for tasks assessed     SENSATION: Light touch: Appears intact Proprioception: Appears intact   PELVIC ALIGNMENT: ASIS are equal  LUMBARAROM/PROM: lumbar ROM is full   LOWER EXTREMITY ROM: full hip ROM   LOWER EXTREMITY MMT: bilateral hip strength is 5/5   PALPATION:   General  contract the abdomen and bulge the lower abdomen.                 External Perineal Exam tenderness located on the right side of the clitoris, along the perineal body, restrictions in the perineal body                             Internal Pelvic Floor tenderness located in the  iliococcygeus bil., left obturator internist, posterior vaginal canal, restrictions on the right ovary, left superior anterior vaginal canal  Patient confirms identification and approves PT to assess internal pelvic floor and treatment Yes  PELVIC MMT:   MMT eval  Vaginal 3/5  Diastasis Recti none  (Blank rows = not tested)        TONE: increased  PROLAPSE: Anterior wall weakness   TODAY'S TREATMENT:                                                                                                                              DATE: 08/15/23 Manual: Soft tissue mobilization: Manual work around the umbilicus Myofascial release: Tissue rolling of the lower abdomen Fascial release to lift the intestines off the bladder Fascial release along the lower quadrants to release the tissue Release of the urachus ligament to reduce pressure on the bladder Exercises: Stretches/mobility: Diaphragmatic breathing to relax the pelvic floor Childs pose with diaphragmatic breathing to open the back rib cage Happy baby holding 30 sec with breath to open the pelvic floor Piriformis supine holding 30 sec bil.  Hip flexor stretch holding 30 sec bil.  Strengthening: Transverse abdominus to engage upper and lower together 10 x    PATIENT EDUCATION: 08/15/23 Education details: Access Code: [email or web address] Person educated: Patient Education method: Explanation, Demonstration, Tactile cues, Verbal cues, and  Handouts Education comprehension: verbalized understanding, returned demonstration, verbal cues required, tactile cues required, and needs further education   HOME EXERCISE PROGRAM: 08/15/23 Access Code: [email or web address] URL: [email or web address] Date: 08/15/2023 Prepared by: Eulis Foster  Exercises - Supine Diaphragmatic Breathing  - 1 x daily - 7 x weekly - 1 sets - 10 reps - Diaphragmatic Breathing in Child's Pose with Pelvic Floor Relaxation  - 1 x daily - 7 x weekly - 1 sets - 10  reps - Hooklying Transversus Abdominis Palpation  - 1 x daily - 7 x weekly - 1 sets - 10 reps - Happy Baby with Pelvic Floor Lengthening  - 1 x daily - 7 x weekly - 1 sets - 1 reps - 30 sec hold - Supine Piriformis Stretch  - 1 x daily - 7 x weekly - 1 sets - 1 reps - 30 sec hold - Half Kneeling Hip Flexor Stretch with Sidebend  - 1 x daily - 7 x weekly - 1 sets - 1 reps - 30 sec hold   ASSESSMENT:  CLINICAL IMPRESSION: Patient is a 37 y.o. female who was seen today for physical therapy  treatment for clitoral incontinence. She had lots of fascial restrictions in the lower abdomen. She was able to perform diaphragmatic breathing and feel the pelvic floor relax. Patient is able to expand her lower  rib cage. She is able to contract the upper and lower rib cage equally now.  Patient will benefit from skilled therapy to improve pelvic floor coordination and reduce tension to improve continence.   OBJECTIVE IMPAIRMENTS: decreased activity tolerance, decreased coordination, decreased strength, increased fascial restrictions, and pain.   ACTIVITY LIMITATIONS: continence  PARTICIPATION LIMITATIONS: interpersonal relationship  PERSONAL FACTORS: Time since onset of injury/illness/exacerbation are also affecting patient's functional outcome.   REHAB POTENTIAL: Excellent  CLINICAL DECISION MAKING: Stable/uncomplicated  EVALUATION COMPLEXITY: Low   GOALS: Goals reviewed with patient? Yes  SHORT TERM GOALS: Target date: 11/24  Patient independent with manual work to the perineal area to reduce restrictions.  Baseline: Goal status: INITIAL  2.  Patient is able to contract the lower abdominals equally with the upper.  Baseline:  Goal status: Met 08/15/23  3.  Patient reports less tenderness around the perineal body and clitoris.  Baseline:  Goal status: INITIAL   LONG TERM GOALS: Target date: 02/06/24  Patient independent with advanced HEP for core and pelvic floor.  Baseline:  Goal  status: INITIAL  2.  Patient is able to have penile penetration vaginally without pain due to reduction of fascial restrictions and trigger points.  Baseline:  Goal status: INITIAL  3.  Patient is able to climax without urinary leakage due to reduction of restrictions.  Baseline:  Goal status: INITIAL  4.  Patient reports overall reduction of urinary leakage >/= 80% due to improved control of the pelvic floor.  Baseline:  Goal status: INITIAL   PLAN:  PT FREQUENCY: 1x/week  PT DURATION: 6 months  PLANNED INTERVENTIONS: 97110-Therapeutic exercises, 97530- Therapeutic activity, 97112- Neuromuscular re-education, 97140- Manual therapy, 97035- Ultrasound, Patient/Family education, Dry Needling, Scar mobilization, Cryotherapy, Moist heat, and Biofeedback  PLAN FOR NEXT SESSION:  manual work to the pelvic floor, clitorus, and perineal body   Eulis Foster, PT 08/15/23 10:21 AM

## 2023-08-16 DIAGNOSIS — J029 Acute pharyngitis, unspecified: Secondary | ICD-10-CM | POA: Diagnosis not present

## 2023-08-18 DIAGNOSIS — A491 Streptococcal infection, unspecified site: Secondary | ICD-10-CM | POA: Diagnosis not present

## 2023-08-22 ENCOUNTER — Encounter: Payer: Self-pay | Admitting: Physical Therapy

## 2023-08-22 ENCOUNTER — Encounter: Payer: BC Managed Care – PPO | Admitting: Physical Therapy

## 2023-08-22 DIAGNOSIS — R252 Cramp and spasm: Secondary | ICD-10-CM

## 2023-08-22 DIAGNOSIS — R278 Other lack of coordination: Secondary | ICD-10-CM

## 2023-08-22 DIAGNOSIS — N39491 Coital incontinence: Secondary | ICD-10-CM | POA: Diagnosis not present

## 2023-08-22 NOTE — Therapy (Signed)
OUTPATIENT PHYSICAL THERAPY FEMALE PELVIC TREATMENT   Patient Name: Heather Richards MRN: [id number] DOB:1986-05-10, 37 y.o., female Today's Date: 08/22/2023  END OF SESSION:  PT End of Session - 08/22/23 1137     Visit Number 3    Date for PT Re-Evaluation 02/06/24    Authorization Type BCBS    Authorization - Visit Number 3    Authorization - Number of Visits 30    PT Start Time 1130    PT Stop Time 1215    PT Time Calculation (min) 45 min    Activity Tolerance Patient tolerated treatment well    Behavior During Therapy [facility] for tasks assessed/performed             Past Medical History:  Diagnosis Date   Allergy    Anemia 2017   ASCUS (atypical squamous cells of undetermined significance) on Pap smear 12/2011   hpv not detected   History of endoscopy 01/2021   Hx of migraines    Hypothyroidism 2017   Normal vaginal delivery 01/21/2016   Rh negative status during pregnancy 01/19/2016   Rubella non-immune status, antepartum 01/19/2016   Thyroid disease    Vaginal Pap smear, abnormal    Past Surgical History:  Procedure Laterality Date   BREAST REDUCTION SURGERY Bilateral 08/09/2021   Procedure: MAMMARY REDUCTION  (BREAST);  Surgeon: Glenna Fellows, MD;  Location: [facility] OR;  Service: Plastics;  Laterality: Bilateral;   EYE SURGERY Bilateral 2013   lasik   WISDOM TOOTH EXTRACTION     There are no problems to display for this patient.   PCP: Merlyn Lot, PA  REFERRING PROVIDER: Sue Lush, FNP   REFERRING DIAG: [phone] (ICD-10-CM) - Coital urinary incontinence  THERAPY DIAG:  Cramp and spasm  Other lack of coordination  Rationale for Evaluation and Treatment: Rehabilitation  ONSET DATE: 2017  SUBJECTIVE:                                                                                                                                                                                           SUBJECTIVE STATEMENT: I had strep throat all last week. I  am able to empty my bladder easier. I was able to climax without peeing on myself for first time.    Fluid intake: Yes: water, hot tea, coffee, seltzer    PAIN:  Are you having pain? No  PRECAUTIONS: None  RED FLAGS: None   WEIGHT BEARING RESTRICTIONS: No  FALLS:  Has patient fallen in last 6 months? No  LIVING ENVIRONMENT: Lives with: lives with their family  OCCUPATION: sitting job, standing desk, under desk treadmill  PLOF: Independent  PATIENT GOALS: reduce leakage, empty bladder fully   PERTINENT HISTORY:  Hypothyroidism Sexual abuse: No  BOWEL MOVEMENT: no issues  URINATION: Pain with urination: No Fully empty bladder: No, after urination still feels like she has to urinate and not able to get anymore out Stream:  average, sometimes may go more to the right Urgency: Yes: sometimes Frequency: every several hours Leakage:  coital incontinence with intercourse, leaking with activity , if waits too long Pads: No  INTERCOURSE: Pain with intercourse: Initial Penetration, feels like cervix in the way at first; pain level 4/10 Ability to have vaginal penetration:  Yes:   Climax: yes Marinoff Scale: 1/3  PREGNANCY: Vaginal deliveries 1, in labor for 48 hours Tearing Yes: 2nd degree tear   PROLAPSE: None   OBJECTIVE:  Note: Objective measures were completed at Evaluation unless otherwise noted.  DIAGNOSTIC FINDINGS:  none   COGNITION: Overall cognitive status: Within functional limits for tasks assessed     SENSATION: Light touch: Appears intact Proprioception: Appears intact   PELVIC ALIGNMENT: ASIS are equal  LUMBARAROM/PROM: lumbar ROM is full   LOWER EXTREMITY ROM: full hip ROM   LOWER EXTREMITY MMT: bilateral hip strength is 5/5   PALPATION:   General  contract the abdomen and bulge the lower abdomen.                 External Perineal Exam tenderness located on the right side of the clitoris, along the perineal body, restrictions  in the perineal body                             Internal Pelvic Floor tenderness located in the iliococcygeus bil., left obturator internist, posterior vaginal canal, restrictions on the right ovary, left superior anterior vaginal canal  Patient confirms identification and approves PT to assess internal pelvic floor and treatment Yes  PELVIC MMT:   MMT eval 08/22/23  Vaginal 3/5 4/5  Diastasis Recti none   (Blank rows = not tested)        TONE: increased  PROLAPSE: Anterior wall weakness   TODAY'S TREATMENT:    08/22/23 Manual: Myofascial release: Fascial release around the clitoris, ischiocavernosus, and perineal body going through the layers of restrictions Internal pelvic floor techniques: No emotional/communication barriers or cognitive limitation. Patient is motivated to learn. Patient understands and agrees with treatment goals and plan. PT explains patient will be examined in standing, sitting, and lying down to see how their muscles and joints work. When they are ready, they will be asked to remove their underwear so PT can examine their perineum. The patient is also given the option of providing their own chaperone as one is not provided in our facility. The patient also has the right and is explained the right to defer or refuse any part of the evaluation or treatment including the internal exam. With the patient's consent, PT will use one gloved finger to gently assess the muscles of the pelvic floor, seeing how well it contracts and relaxes and if there is muscle symmetry. After, the patient will get dressed and PT and patient will discuss exam findings and plan of care. PT and patient discuss plan of care, schedule, attendance policy and HEP activities.  Going through the vaginal canal working on the left side of the bladder, left levator ani and obturator internist with left hip motion, released around the left side of the bladder Working on the cervix that  appears to be  tight on the right                                                                                                                              DATE: 08/15/23 Manual: Soft tissue mobilization: Manual work around the umbilicus Myofascial release: Tissue rolling of the lower abdomen Fascial release to lift the intestines off the bladder Fascial release along the lower quadrants to release the tissue Release of the urachus ligament to reduce pressure on the bladder Exercises: Stretches/mobility: Diaphragmatic breathing to relax the pelvic floor Childs pose with diaphragmatic breathing to open the back rib cage Happy baby holding 30 sec with breath to open the pelvic floor Piriformis supine holding 30 sec bil.  Hip flexor stretch holding 30 sec bil.  Strengthening: Transverse abdominus to engage upper and lower together 10 x    PATIENT EDUCATION: 08/15/23 Education details: Access Code: [email or web address] Person educated: Patient Education method: Programmer, multimedia, Demonstration, Actor cues, Verbal cues, and Handouts Education comprehension: verbalized understanding, returned demonstration, verbal cues required, tactile cues required, and needs further education   HOME EXERCISE PROGRAM: 08/15/23 Access Code: [email or web address] URL: [email or web address] Date: 08/15/2023 Prepared by: Eulis Foster  Exercises - Supine Diaphragmatic Breathing  - 1 x daily - 7 x weekly - 1 sets - 10 reps - Diaphragmatic Breathing in Child's Pose with Pelvic Floor Relaxation  - 1 x daily - 7 x weekly - 1 sets - 10 reps - Hooklying Transversus Abdominis Palpation  - 1 x daily - 7 x weekly - 1 sets - 10 reps - Happy Baby with Pelvic Floor Lengthening  - 1 x daily - 7 x weekly - 1 sets - 1 reps - 30 sec hold - Supine Piriformis Stretch  - 1 x daily - 7 x weekly - 1 sets - 1 reps - 30 sec hold - Half Kneeling Hip Flexor Stretch with Sidebend  - 1 x daily - 7 x weekly - 1 sets - 1 reps - 30 sec  hold   ASSESSMENT:  CLINICAL IMPRESSION: Patient is a 37 y.o. female who was seen today for physical therapy  treatment for clitoral incontinence. Patient was able to fully empty her bladder after manual work. She had restrictions around the clitoris, bladder ad left vaginal wall. Pelvic floor strength increased to 4/5.  Her cervix feels restricted to the right.  Patient will benefit from skilled therapy to improve pelvic floor coordination and reduce tension to improve continence.   OBJECTIVE IMPAIRMENTS: decreased activity tolerance, decreased coordination, decreased strength, increased fascial restrictions, and pain.   ACTIVITY LIMITATIONS: continence  PARTICIPATION LIMITATIONS: interpersonal relationship  PERSONAL FACTORS: Time since onset of injury/illness/exacerbation are also affecting patient's functional outcome.   REHAB POTENTIAL: Excellent  CLINICAL DECISION MAKING: Stable/uncomplicated  EVALUATION COMPLEXITY: Low   GOALS: Goals reviewed with patient? Yes  SHORT TERM GOALS: Target date: 11/24  Patient independent with manual work to the perineal area to reduce restrictions.  Baseline: Goal status: INITIAL  2.  Patient is able to contract the lower abdominals equally with the upper.  Baseline:  Goal status: Met 08/15/23  3.  Patient reports less tenderness around the perineal body and clitoris.  Baseline:  Goal status: INITIAL   LONG TERM GOALS: Target date: 02/06/24  Patient independent with advanced HEP for core and pelvic floor.  Baseline:  Goal status: INITIAL  2.  Patient is able to have penile penetration vaginally without pain due to reduction of fascial restrictions and trigger points.  Baseline:  Goal status: INITIAL  3.  Patient is able to climax without urinary leakage due to reduction of restrictions.  Baseline:  Goal status: INITIAL  4.  Patient reports overall reduction of urinary leakage >/= 80% due to improved control of the pelvic  floor.  Baseline:  Goal status: INITIAL   PLAN:  PT FREQUENCY: 1x/week  PT DURATION: 6 months  PLANNED INTERVENTIONS: 97110-Therapeutic exercises, 97530- Therapeutic activity, 97112- Neuromuscular re-education, 97140- Manual therapy, 97035- Ultrasound, Patient/Family education, Dry Needling, Scar mobilization, Cryotherapy, Moist heat, and Biofeedback  PLAN FOR NEXT SESSION:  manual work to the pelvic floor, clitorus, and perineal body, along the right side of cervix and bladder on cycle then core work   Eulis Foster, PT 08/22/23 1:03 PM

## 2023-08-23 DIAGNOSIS — F321 Major depressive disorder, single episode, moderate: Secondary | ICD-10-CM | POA: Diagnosis not present

## 2023-08-29 ENCOUNTER — Encounter: Payer: BC Managed Care – PPO | Attending: Obstetrics and Gynecology | Admitting: Physical Therapy

## 2023-08-29 ENCOUNTER — Encounter: Payer: Self-pay | Admitting: Physical Therapy

## 2023-08-29 DIAGNOSIS — R278 Other lack of coordination: Secondary | ICD-10-CM | POA: Diagnosis not present

## 2023-08-29 DIAGNOSIS — R252 Cramp and spasm: Secondary | ICD-10-CM | POA: Insufficient documentation

## 2023-08-29 DIAGNOSIS — N39491 Coital incontinence: Secondary | ICD-10-CM | POA: Insufficient documentation

## 2023-08-29 NOTE — Therapy (Signed)
OUTPATIENT PHYSICAL THERAPY FEMALE PELVIC TREATMENT   Patient Name: Heather Richards MRN: [id number] DOB:May 30, 1986, 37 y.o., female Today's Date: 08/29/2023  END OF SESSION:  PT End of Session - 08/29/23 1140     Visit Number 4    Date for PT Re-Evaluation 02/06/24    Authorization Type BCBS    Authorization - Visit Number 4    Authorization - Number of Visits 30    PT Start Time 1130    PT Stop Time 1215    PT Time Calculation (min) 45 min    Activity Tolerance Patient tolerated treatment well    Behavior During Therapy [facility] for tasks assessed/performed             Past Medical History:  Diagnosis Date   Allergy    Anemia 2017   ASCUS (atypical squamous cells of undetermined significance) on Pap smear 12/2011   hpv not detected   History of endoscopy 01/2021   Hx of migraines    Hypothyroidism 2017   Normal vaginal delivery 01/21/2016   Rh negative status during pregnancy 01/19/2016   Rubella non-immune status, antepartum 01/19/2016   Thyroid disease    Vaginal Pap smear, abnormal    Past Surgical History:  Procedure Laterality Date   BREAST REDUCTION SURGERY Bilateral 08/09/2021   Procedure: MAMMARY REDUCTION  (BREAST);  Surgeon: Glenna Fellows, MD;  Location: [facility] OR;  Service: Plastics;  Laterality: Bilateral;   EYE SURGERY Bilateral 2013   lasik   WISDOM TOOTH EXTRACTION     There are no problems to display for this patient.   PCP: Merlyn Lot, PA  REFERRING PROVIDER: Sue Lush, FNP   REFERRING DIAG: [phone] (ICD-10-CM) - Coital urinary incontinence  THERAPY DIAG:  Cramp and spasm  Other lack of coordination  Rationale for Evaluation and Treatment: Rehabilitation  ONSET DATE: 2017  SUBJECTIVE:                                                                                                                                                                                           SUBJECTIVE STATEMENT: I am able to empty my bladder  better. I am on my cycle. I use the menstrual cup. I am using the smaller cup.      Fluid intake: Yes: water, hot tea, coffee, seltzer    PAIN:  Are you having pain? No  PRECAUTIONS: None  RED FLAGS: None   WEIGHT BEARING RESTRICTIONS: No  FALLS:  Has patient fallen in last 6 months? No  LIVING ENVIRONMENT: Lives with: lives with their family  OCCUPATION: sitting job, standing desk, under desk treadmill  PLOF: Independent  PATIENT GOALS: reduce leakage, empty bladder fully   PERTINENT HISTORY:  Hypothyroidism Sexual abuse: No  BOWEL MOVEMENT: no issues  URINATION: Pain with urination: No Fully empty bladder: No, after urination still feels like she has to urinate and not able to get anymore out Stream:  average, sometimes may go more to the right Urgency: Yes: sometimes Frequency: every several hours Leakage:  coital incontinence with intercourse, leaking with activity , if waits too long Pads: No  INTERCOURSE: Pain with intercourse: Initial Penetration, feels like cervix in the way at first; pain level 4/10 Ability to have vaginal penetration:  Yes:   Climax: yes Marinoff Scale: 1/3  PREGNANCY: Vaginal deliveries 1, in labor for 48 hours Tearing Yes: 2nd degree tear   PROLAPSE: None   OBJECTIVE:  Note: Objective measures were completed at Evaluation unless otherwise noted.  DIAGNOSTIC FINDINGS:  none   COGNITION: Overall cognitive status: Within functional limits for tasks assessed     SENSATION: Light touch: Appears intact Proprioception: Appears intact   PELVIC ALIGNMENT: ASIS are equal  LUMBARAROM/PROM: lumbar ROM is full   LOWER EXTREMITY ROM: full hip ROM   LOWER EXTREMITY MMT: bilateral hip strength is 5/5   PALPATION:   General  contract the abdomen and bulge the lower abdomen.                 External Perineal Exam tenderness located on the right side of the clitoris, along the perineal body, restrictions in the perineal  body                             Internal Pelvic Floor tenderness located in the iliococcygeus bil., left obturator internist, posterior vaginal canal, restrictions on the right ovary, left superior anterior vaginal canal  Patient confirms identification and approves PT to assess internal pelvic floor and treatment Yes  PELVIC MMT:   MMT eval 08/22/23  Vaginal 3/5 4/5  Diastasis Recti none   (Blank rows = not tested)        TONE: increased  PROLAPSE: Anterior wall weakness   TODAY'S TREATMENT:    08/29/23 Exercises: Stretches/mobility: Geanie Cooley on balls along the mid and upper thoracic spine to mobilize the joints Two balls along the suboccipital area with chin tucks Supine with foam roll perpendicular to the spine and arch the thoracic then roll the foam roll along the spine to increase thoracic extension Strengthening: Transverse abdominus with ball squeeze 5 x  Bridge with ball squeeze 10 x  Single leg bridge with ball squeeze 10 x each side Supine march with ball between knees 10 x each side Supine press ball into knee and move the other leg up and down 10 x each leg Quadruped lift extremity with abdominal engagement 10 x each side Bear plank with ball squeeze     08/22/23 Manual: Myofascial release: Fascial release around the clitoris, ischiocavernosus, and perineal body going through the layers of restrictions Internal pelvic floor techniques: No emotional/communication barriers or cognitive limitation. Patient is motivated to learn. Patient understands and agrees with treatment goals and plan. PT explains patient will be examined in standing, sitting, and lying down to see how their muscles and joints work. When they are ready, they will be asked to remove their underwear so PT can examine their perineum. The patient is also given the option of providing their own chaperone as one is not provided in our facility. The patient also has  the right and is explained the right to  defer or refuse any part of the evaluation or treatment including the internal exam. With the patient's consent, PT will use one gloved finger to gently assess the muscles of the pelvic floor, seeing how well it contracts and relaxes and if there is muscle symmetry. After, the patient will get dressed and PT and patient will discuss exam findings and plan of care. PT and patient discuss plan of care, schedule, attendance policy and HEP activities.  Going through the vaginal canal working on the left side of the bladder, left levator ani and obturator internist with left hip motion, released around the left side of the bladder Working on the cervix that appears to be tight on the right                                                                                                                              DATE: 08/15/23 Manual: Soft tissue mobilization: Manual work around the umbilicus Myofascial release: Tissue rolling of the lower abdomen Fascial release to lift the intestines off the bladder Fascial release along the lower quadrants to release the tissue Release of the urachus ligament to reduce pressure on the bladder Exercises: Stretches/mobility: Diaphragmatic breathing to relax the pelvic floor Childs pose with diaphragmatic breathing to open the back rib cage Happy baby holding 30 sec with breath to open the pelvic floor Piriformis supine holding 30 sec bil.  Hip flexor stretch holding 30 sec bil.  Strengthening: Transverse abdominus to engage upper and lower together 10 x    PATIENT EDUCATION: 08/29/23 Education details: Access Code: [email or web address] Person educated: Patient Education method: Programmer, multimedia, Demonstration, Actor cues, Verbal cues, and Handouts Education comprehension: verbalized understanding, returned demonstration, verbal cues required, tactile cues required, and needs further education   HOME EXERCISE PROGRAM: 08/29/23 Access Code: [email or web address] URL:  [email or web address] Date: 08/29/2023 Prepared by: Eulis Foster  Exercises - Supine Diaphragmatic Breathing  - 1 x daily - 7 x weekly - 1 sets - 10 reps - Diaphragmatic Breathing in Child's Pose with Pelvic Floor Relaxation  - 1 x daily - 7 x weekly - 1 sets - 10 reps - Hooklying Transversus Abdominis Palpation  - 1 x daily - 7 x weekly - 1 sets - 10 reps - Happy Baby with Pelvic Floor Lengthening  - 1 x daily - 7 x weekly - 1 sets - 1 reps - 30 sec hold - Supine Piriformis Stretch  - 1 x daily - 7 x weekly - 1 sets - 1 reps - 30 sec hold - Half Kneeling Hip Flexor Stretch with Sidebend  - 1 x daily - 7 x weekly - 1 sets - 1 reps - 30 sec hold - Single Leg Bridge  - 1 x daily - 3 x weekly - 2 sets - 10 reps - Supine March  - 1 x daily - 3 x weekly - 2 sets - 10 reps -  Hooklying Isometric Hip Flexion  - 1 x daily - 3 x weekly - 2 sets - 10 reps - Quadruped Pelvic Floor Contraction with Opposite Arm and Leg Lift  - 1 x daily - 3 x weekly - 2 sets - 10 reps - Bear Plank from [employer]  - 1 x daily - 3 x weekly - 1 sets - 10 reps   ASSESSMENT:  CLINICAL IMPRESSION: Patient is a 37 y.o. female who was seen today for physical therapy  treatment for clitoral incontinence. Patient emptying her bladder 50% better. Patient does not feel like she has to urinate again.  Patient still feels the trigger points in the clitoris and perineal body.  Patient continues to have urinary leakage. She is able to perform her HEP with slight difficulty. Patient is able to contract the upper and lower abdomen equally. Patient will benefit from skilled therapy to improve pelvic floor coordination and reduce tension to improve continence.   OBJECTIVE IMPAIRMENTS: decreased activity tolerance, decreased coordination, decreased strength, increased fascial restrictions, and pain.   ACTIVITY LIMITATIONS: continence  PARTICIPATION LIMITATIONS: interpersonal relationship  PERSONAL FACTORS: Time since onset  of injury/illness/exacerbation are also affecting patient's functional outcome.   REHAB POTENTIAL: Excellent  CLINICAL DECISION MAKING: Stable/uncomplicated  EVALUATION COMPLEXITY: Low   GOALS: Goals reviewed with patient? Yes  SHORT TERM GOALS: Target date: 11/24  Patient independent with manual work to the perineal area to reduce restrictions.  Baseline: Goal status: Met 08/29/23  2.  Patient is able to contract the lower abdominals equally with the upper.  Baseline:  Goal status: Met 08/15/23  3.  Patient reports less tenderness around the perineal body and clitoris.  Baseline:  Goal status: Met 08/29/23   LONG TERM GOALS: Target date: 02/06/24  Patient independent with advanced HEP for core and pelvic floor.  Baseline:  Goal status: INITIAL  2.  Patient is able to have penile penetration vaginally without pain due to reduction of fascial restrictions and trigger points.  Baseline:  Goal status: INITIAL  3.  Patient is able to climax without urinary leakage due to reduction of restrictions.  Baseline:  Goal status: INITIAL  4.  Patient reports overall reduction of urinary leakage >/= 80% due to improved control of the pelvic floor.  Baseline:  Goal status: INITIAL   PLAN:  PT FREQUENCY: 1x/week  PT DURATION: 6 months  PLANNED INTERVENTIONS: 97110-Therapeutic exercises, 97530- Therapeutic activity, 97112- Neuromuscular re-education, 97140- Manual therapy, 97035- Ultrasound, Patient/Family education, Dry Needling, Scar mobilization, Cryotherapy, Moist heat, and Biofeedback  PLAN FOR NEXT SESSION:  manual work to the pelvic floor, clitorus, and perineal body, along the right side of cervix and bladder on cycle then core work   Eulis Foster, PT 08/29/23 12:15 PM

## 2023-09-01 ENCOUNTER — Other Ambulatory Visit: Payer: Self-pay | Admitting: Medical Genetics

## 2023-09-01 DIAGNOSIS — Z006 Encounter for examination for normal comparison and control in clinical research program: Secondary | ICD-10-CM

## 2023-09-07 ENCOUNTER — Encounter: Payer: BC Managed Care – PPO | Admitting: Physical Therapy

## 2023-09-11 DIAGNOSIS — F321 Major depressive disorder, single episode, moderate: Secondary | ICD-10-CM | POA: Diagnosis not present

## 2023-09-19 ENCOUNTER — Encounter: Payer: BC Managed Care – PPO | Admitting: Physical Therapy

## 2023-09-19 ENCOUNTER — Encounter: Payer: Self-pay | Admitting: Physical Therapy

## 2023-09-19 DIAGNOSIS — R252 Cramp and spasm: Secondary | ICD-10-CM

## 2023-09-19 DIAGNOSIS — N39491 Coital incontinence: Secondary | ICD-10-CM | POA: Diagnosis not present

## 2023-09-19 DIAGNOSIS — R278 Other lack of coordination: Secondary | ICD-10-CM

## 2023-09-19 NOTE — Therapy (Signed)
OUTPATIENT PHYSICAL THERAPY FEMALE PELVIC TREATMENT   Patient Name: Heather Richards MRN: [id number] DOB:12/08/1985, 37 y.o., female Today's Date: 09/19/2023  END OF SESSION:  PT End of Session - 09/19/23 1405     Visit Number 5    Date for PT Re-Evaluation 02/06/24    Authorization Type BCBS    Authorization - Visit Number 5    Authorization - Number of Visits 30    PT Start Time 1400    PT Stop Time 1445    PT Time Calculation (min) 45 min    Activity Tolerance Patient tolerated treatment well    Behavior During Therapy [facility] for tasks assessed/performed             Past Medical History:  Diagnosis Date   Allergy    Anemia 2017   ASCUS (atypical squamous cells of undetermined significance) on Pap smear 12/2011   hpv not detected   History of endoscopy 01/2021   Hx of migraines    Hypothyroidism 2017   Normal vaginal delivery 01/21/2016   Rh negative status during pregnancy 01/19/2016   Rubella non-immune status, antepartum 01/19/2016   Thyroid disease    Vaginal Pap smear, abnormal    Past Surgical History:  Procedure Laterality Date   BREAST REDUCTION SURGERY Bilateral 08/09/2021   Procedure: MAMMARY REDUCTION  (BREAST);  Surgeon: Glenna Fellows, MD;  Location: [facility] OR;  Service: Plastics;  Laterality: Bilateral;   EYE SURGERY Bilateral 2013   lasik   WISDOM TOOTH EXTRACTION     There are no problems to display for this patient.   PCP: Merlyn Lot, PA  REFERRING PROVIDER: Sue Lush, FNP   REFERRING DIAG: [phone] (ICD-10-CM) - Coital urinary incontinence  THERAPY DIAG:  Cramp and spasm  Other lack of coordination  Rationale for Evaluation and Treatment: Rehabilitation  ONSET DATE: 2017  SUBJECTIVE:                                                                                                                                                                                           SUBJECTIVE STATEMENT: I still have pain in the same spot.  No urinary leakage with climax.   Fluid intake: Yes: water, hot tea, coffee, seltzer    PAIN:  Are you having pain? No  PRECAUTIONS: None  RED FLAGS: None   WEIGHT BEARING RESTRICTIONS: No  FALLS:  Has patient fallen in last 6 months? No  LIVING ENVIRONMENT: Lives with: lives with their family  OCCUPATION: sitting job, standing desk, under desk treadmill  PLOF: Independent  PATIENT GOALS: reduce leakage, empty bladder fully   PERTINENT HISTORY:  Hypothyroidism Sexual abuse: No  BOWEL MOVEMENT: no issues  URINATION: Pain with urination: No Fully empty bladder: No, after urination still feels like she has to urinate and not able to get anymore out Stream:  average, sometimes may go more to the right Urgency: Yes: sometimes Frequency: every several hours Leakage:  coital incontinence with intercourse, leaking with activity , if waits too long Pads: No  INTERCOURSE: Pain with intercourse: Initial Penetration, feels like cervix in the way at first; pain level 4/10 Ability to have vaginal penetration:  Yes:   Climax: yes Marinoff Scale: 1/3  PREGNANCY: Vaginal deliveries 1, in labor for 48 hours Tearing Yes: 2nd degree tear   PROLAPSE: None   OBJECTIVE:  Note: Objective measures were completed at Evaluation unless otherwise noted.  DIAGNOSTIC FINDINGS:  none   COGNITION: Overall cognitive status: Within functional limits for tasks assessed     SENSATION: Light touch: Appears intact Proprioception: Appears intact   PELVIC ALIGNMENT: ASIS are equal  LUMBARAROM/PROM: lumbar ROM is full   LOWER EXTREMITY ROM: full hip ROM   LOWER EXTREMITY MMT: bilateral hip strength is 5/5   PALPATION:   General  contract the abdomen and bulge the lower abdomen.                 External Perineal Exam tenderness located on the right side of the clitoris, along the perineal body, restrictions in the perineal body                             Internal Pelvic  Floor tenderness located in the iliococcygeus bil., left obturator internist, posterior vaginal canal, restrictions on the right ovary, left superior anterior vaginal canal  Patient confirms identification and approves PT to assess internal pelvic floor and treatment Yes  PELVIC MMT:   MMT eval 08/22/23 09/19/23  Vaginal 3/5 4/5 4/5  Diastasis Recti none    (Blank rows = not tested)        TONE: increased  PROLAPSE: Anterior wall weakness   TODAY'S TREATMENT:    09/19/23 Manual: Internal pelvic floor techniques: No emotional/communication barriers or cognitive limitation. Patient is motivated to learn. Patient understands and agrees with treatment goals and plan. PT explains patient will be examined in standing, sitting, and lying down to see how their muscles and joints work. When they are ready, they will be asked to remove their underwear so PT can examine their perineum. The patient is also given the option of providing their own chaperone as one is not provided in our facility. The patient also has the right and is explained the right to defer or refuse any part of the evaluation or treatment including the internal exam. With the patient's consent, PT will use one gloved finger to gently assess the muscles of the pelvic floor, seeing how well it contracts and relaxes and if there is muscle symmetry. After, the patient will get dressed and PT and patient will discuss exam findings and plan of care. PT and patient discuss plan of care, schedule, attendance policy and HEP activities.  Going through the vaginal and rectum working on the obturator internist, levator ani, along the puborectalis, anococcygeal ligament, lengthing along the ATLA, and urethra sphincter Neuromuscular re-education: Pelvic floor contraction training: Therapist finger in the vaginal canal working on pelvic floor contraction with a lift while laying on her side and verbal cues to lift her bladder and not hold her  breath  08/29/23  Exercises: Stretches/mobility: Lay on balls along the mid and upper thoracic spine to mobilize the joints Two balls along the suboccipital area with chin tucks Supine with foam roll perpendicular to the spine and arch the thoracic then roll the foam roll along the spine to increase thoracic extension Strengthening: Transverse abdominus with ball squeeze 5 x  Bridge with ball squeeze 10 x  Single leg bridge with ball squeeze 10 x each side Supine march with ball between knees 10 x each side Supine press ball into knee and move the other leg up and down 10 x each leg Quadruped lift extremity with abdominal engagement 10 x each side Bear plank with ball squeeze     08/22/23 Manual: Myofascial release: Fascial release around the clitoris, ischiocavernosus, and perineal body going through the layers of restrictions Internal pelvic floor techniques: No emotional/communication barriers or cognitive limitation. Patient is motivated to learn. Patient understands and agrees with treatment goals and plan. PT explains patient will be examined in standing, sitting, and lying down to see how their muscles and joints work. When they are ready, they will be asked to remove their underwear so PT can examine their perineum. The patient is also given the option of providing their own chaperone as one is not provided in our facility. The patient also has the right and is explained the right to defer or refuse any part of the evaluation or treatment including the internal exam. With the patient's consent, PT will use one gloved finger to gently assess the muscles of the pelvic floor, seeing how well it contracts and relaxes and if there is muscle symmetry. After, the patient will get dressed and PT and patient will discuss exam findings and plan of care. PT and patient discuss plan of care, schedule, attendance policy and HEP activities.  Going through the vaginal canal working on the left side of  the bladder, left levator ani and obturator internist with left hip motion, released around the left side of the bladder Working on the cervix that appears to be tight on the right  PATIENT EDUCATION: 08/29/23 Education details: Access Code: [email or web address] Person educated: Patient Education method: Explanation, Demonstration, Tactile cues, Verbal cues, and Handouts Education comprehension: verbalized understanding, returned demonstration, verbal cues required, tactile cues required, and needs further education   HOME EXERCISE PROGRAM: 08/29/23 Access Code: [email or web address] URL: [email or web address] Date: 08/29/2023 Prepared by: Eulis Foster  Exercises - Supine Diaphragmatic Breathing  - 1 x daily - 7 x weekly - 1 sets - 10 reps - Diaphragmatic Breathing in Child's Pose with Pelvic Floor Relaxation  - 1 x daily - 7 x weekly - 1 sets - 10 reps - Hooklying Transversus Abdominis Palpation  - 1 x daily - 7 x weekly - 1 sets - 10 reps - Happy Baby with Pelvic Floor Lengthening  - 1 x daily - 7 x weekly - 1 sets - 1 reps - 30 sec hold - Supine Piriformis Stretch  - 1 x daily - 7 x weekly - 1 sets - 1 reps - 30 sec hold - Half Kneeling Hip Flexor Stretch with Sidebend  - 1 x daily - 7 x weekly - 1 sets - 1 reps - 30 sec hold - Single Leg Bridge  - 1 x daily - 3 x weekly - 2 sets - 10 reps - Supine March  - 1 x daily - 3 x weekly - 2 sets - 10 reps - Hooklying Isometric Hip Flexion  - 1 x daily -  3 x weekly - 2 sets - 10 reps - Quadruped Pelvic Floor Contraction with Opposite Arm and Leg Lift  - 1 x daily - 3 x weekly - 2 sets - 10 reps - Bear Plank from [employer]  - 1 x daily - 3 x weekly - 1 sets - 10 reps   ASSESSMENT:  CLINICAL IMPRESSION: Patient is a 37 y.o. female who was seen today for physical therapy  treatment for clitoral incontinence. Patient emptying her bladder 50% better. Patient feels sometimes still difficult with emptying her bladder. Patient has not leaked urine since last  visit.   She has not leaked with orgasm.  Pelvic floor strength is 4/5. She had good releases of the pelvic floor muscles and they feel softer than when started.   Patient will benefit from skilled therapy to improve pelvic floor coordination and reduce tension to improve continence.   OBJECTIVE IMPAIRMENTS: decreased activity tolerance, decreased coordination, decreased strength, increased fascial restrictions, and pain.   ACTIVITY LIMITATIONS: continence  PARTICIPATION LIMITATIONS: interpersonal relationship  PERSONAL FACTORS: Time since onset of injury/illness/exacerbation are also affecting patient's functional outcome.   REHAB POTENTIAL: Excellent  CLINICAL DECISION MAKING: Stable/uncomplicated  EVALUATION COMPLEXITY: Low   GOALS: Goals reviewed with patient? Yes  SHORT TERM GOALS: Target date: 11/24  Patient independent with manual work to the perineal area to reduce restrictions.  Baseline: Goal status: Met 08/29/23  2.  Patient is able to contract the lower abdominals equally with the upper.  Baseline:  Goal status: Met 08/15/23  3.  Patient reports less tenderness around the perineal body and clitoris.  Baseline:  Goal status: Met 08/29/23   LONG TERM GOALS: Target date: 02/06/24  Patient independent with advanced HEP for core and pelvic floor.  Baseline:  Goal status: INITIAL  2.  Patient is able to have penile penetration vaginally without pain due to reduction of fascial restrictions and trigger points.  Baseline:  Goal status: INITIAL  3.  Patient is able to climax without urinary leakage due to reduction of restrictions.  Baseline:  Goal status: Met 09/19/23  4.  Patient reports overall reduction of urinary leakage >/= 80% due to improved control of the pelvic floor.  Baseline:  Goal status: Met 09/19/23   PLAN:  PT FREQUENCY: 1x/week  PT DURATION: 6 months  PLANNED INTERVENTIONS: 97110-Therapeutic exercises, 97530- Therapeutic activity, 97112-  Neuromuscular re-education, 97140- Manual therapy, 97035- Ultrasound, Patient/Family education, Dry Needling, Scar mobilization, Cryotherapy, Moist heat, and Biofeedback  PLAN FOR NEXT SESSION:  update HEP, check on pain with intercourse and emptying her bladder, if doing well then discharge  Eulis Foster, PT 09/19/23 2:52 PM

## 2023-09-28 ENCOUNTER — Encounter: Payer: Self-pay | Admitting: Physical Therapy

## 2023-09-28 ENCOUNTER — Encounter: Payer: BC Managed Care – PPO | Attending: Obstetrics and Gynecology | Admitting: Physical Therapy

## 2023-09-28 DIAGNOSIS — R252 Cramp and spasm: Secondary | ICD-10-CM | POA: Diagnosis not present

## 2023-09-28 DIAGNOSIS — R278 Other lack of coordination: Secondary | ICD-10-CM | POA: Insufficient documentation

## 2023-09-28 NOTE — Therapy (Addendum)
OUTPATIENT PHYSICAL THERAPY FEMALE PELVIC TREATMENT   Patient Name: Heather Richards MRN: [id number] DOB:April 24, 1986, 37 y.o., female Today's Date: 09/28/2023  END OF SESSION:  PT End of Session - 09/28/23 1134     Visit Number 6    Date for PT Re-Evaluation 02/06/24    Authorization Type BCBS    Authorization - Visit Number 6    Authorization - Number of Visits 30    PT Start Time 1132    PT Stop Time 1217    PT Time Calculation (min) 45 min    Activity Tolerance Patient tolerated treatment well    Behavior During Therapy [facility] for tasks assessed/performed             Past Medical History:  Diagnosis Date   Allergy    Anemia 2017   ASCUS (atypical squamous cells of undetermined significance) on Pap smear 12/2011   hpv not detected   History of endoscopy 01/2021   Hx of migraines    Hypothyroidism 2017   Normal vaginal delivery 01/21/2016   Rh negative status during pregnancy 01/19/2016   Rubella non-immune status, antepartum 01/19/2016   Thyroid disease    Vaginal Pap smear, abnormal    Past Surgical History:  Procedure Laterality Date   BREAST REDUCTION SURGERY Bilateral 08/09/2021   Procedure: MAMMARY REDUCTION  (BREAST);  Surgeon: Glenna Fellows, MD;  Location: [facility] OR;  Service: Plastics;  Laterality: Bilateral;   EYE SURGERY Bilateral 2013   lasik   WISDOM TOOTH EXTRACTION     There are no problems to display for this patient.   PCP: Merlyn Lot, PA  REFERRING PROVIDER: Sue Lush, FNP   REFERRING DIAG: [id number] (ICD-10-CM) - Coital urinary incontinence  THERAPY DIAG:  Cramp and spasm  Other lack of coordination  Rationale for Evaluation and Treatment: Rehabilitation  ONSET DATE: 2017  SUBJECTIVE:                                                                                                                                                                                           SUBJECTIVE STATEMENT: No intercourse since last week. Able  to empty the bladder all the way this past week. No urinary leakage since last week.  Fluid intake: Yes: water, hot tea, coffee, seltzer    PAIN:  Are you having pain? No  PRECAUTIONS: None  RED FLAGS: None   WEIGHT BEARING RESTRICTIONS: No  FALLS:  Has patient fallen in last 6 months? No  LIVING ENVIRONMENT: Lives with: lives with their family  OCCUPATION: sitting job, standing desk, under desk treadmill  PLOF: Independent  PATIENT GOALS: reduce  leakage, empty bladder fully   PERTINENT HISTORY:  Hypothyroidism Sexual abuse: No  BOWEL MOVEMENT: no issues  URINATION: Pain with urination: No Fully empty bladder: No, after urination still feels like she has to urinate and not able to get anymore out Stream:  average, sometimes may go more to the right Urgency: Yes: sometimes Frequency: every several hours Leakage:  coital incontinence with intercourse, leaking with activity , if waits too long Pads: No  INTERCOURSE: Pain with intercourse: Initial Penetration, feels like cervix in the way at first; pain level 4/10 Ability to have vaginal penetration:  Yes:   Climax: yes Marinoff Scale: 1/3  PREGNANCY: Vaginal deliveries 1, in labor for 48 hours Tearing Yes: 2nd degree tear   PROLAPSE: None   OBJECTIVE:  Note: Objective measures were completed at Evaluation unless otherwise noted.  DIAGNOSTIC FINDINGS:  none   COGNITION: Overall cognitive status: Within functional limits for tasks assessed     SENSATION: Light touch: Appears intact Proprioception: Appears intact   PELVIC ALIGNMENT: ASIS are equal  LUMBARAROM/PROM: lumbar ROM is full   LOWER EXTREMITY ROM: full hip ROM   LOWER EXTREMITY MMT: bilateral hip strength is 5/5   PALPATION:   General  contract the abdomen and bulge the lower abdomen.                 External Perineal Exam tenderness located on the right side of the clitoris, along the perineal body, restrictions in the perineal  body                             Internal Pelvic Floor tenderness located in the iliococcygeus bil., left obturator internist, posterior vaginal canal, restrictions on the right ovary, left superior anterior vaginal canal  Patient confirms identification and approves PT to assess internal pelvic floor and treatment Yes  PELVIC MMT:   MMT eval 08/22/23 09/19/23  Vaginal 3/5 4/5 4/5  Diastasis Recti none    (Blank rows = not tested)        TONE: increased  PROLAPSE: Anterior wall weakness   TODAY'S TREATMENT:    09/29/23 Exercises: Strengthening: Single leg bridge  10 x each side Supine hip flexion isometric then straighten the leg 10 x each leg Dead bug 20 x  Side plank with lifting hips then abduct the top leg 10 x each  Plank into a bear plank 10 x  Quadruped lift extremity 10 x  Bird dog knee taps 20 x   09/19/23 Manual: Internal pelvic floor techniques: No emotional/communication barriers or cognitive limitation. Patient is motivated to learn. Patient understands and agrees with treatment goals and plan. PT explains patient will be examined in standing, sitting, and lying down to see how their muscles and joints work. When they are ready, they will be asked to remove their underwear so PT can examine their perineum. The patient is also given the option of providing their own chaperone as one is not provided in our facility. The patient also has the right and is explained the right to defer or refuse any part of the evaluation or treatment including the internal exam. With the patient's consent, PT will use one gloved finger to gently assess the muscles of the pelvic floor, seeing how well it contracts and relaxes and if there is muscle symmetry. After, the patient will get dressed and PT and patient will discuss exam findings and plan of care. PT and patient discuss plan of  care, schedule, attendance policy and HEP activities.  Going through the vaginal and rectum working on the  obturator internist, levator ani, along the puborectalis, anococcygeal ligament, lengthing along the ATLA, and urethra sphincter Neuromuscular re-education: Pelvic floor contraction training: Therapist finger in the vaginal canal working on pelvic floor contraction with a lift while laying on her side and verbal cues to lift her bladder and not hold her breath  08/29/23 Exercises: Stretches/mobility: Lay on balls along the mid and upper thoracic spine to mobilize the joints Two balls along the suboccipital area with chin tucks Supine with foam roll perpendicular to the spine and arch the thoracic then roll the foam roll along the spine to increase thoracic extension Strengthening: Transverse abdominus with ball squeeze 5 x  Bridge with ball squeeze 10 x  Single leg bridge with ball squeeze 10 x each side Supine march with ball between knees 10 x each side Supine press ball into knee and move the other leg up and down 10 x each leg Quadruped lift extremity with abdominal engagement 10 x each side Bear plank with ball squeeze     PATIENT EDUCATION: 09/28/23 Education details: Access Code: [email or web address] Person educated: Patient Education method: Programmer, multimedia, Facilities manager, Actor cues, Verbal cues, and Handouts Education comprehension: verbalized understanding, returned demonstration, verbal cues required, tactile cues required, and needs further education   HOME EXERCISE PROGRAM: 09/28/23 Access Code: [email or web address] URL: [email or web address] Date: 09/28/2023 Prepared by: Eulis Foster  Exercises - Supine Diaphragmatic Breathing  - 1 x daily - 7 x weekly - 1 sets - 10 reps - Diaphragmatic Breathing in Child's Pose with Pelvic Floor Relaxation  - 1 x daily - 7 x weekly - 1 sets - 10 reps - Hooklying Transversus Abdominis Palpation  - 1 x daily - 7 x weekly - 1 sets - 10 reps - Happy Baby with Pelvic Floor Lengthening  - 1 x daily - 7 x weekly - 1 sets - 1 reps - 30 sec hold -  Supine Piriformis Stretch  - 1 x daily - 7 x weekly - 1 sets - 1 reps - 30 sec hold - Half Kneeling Hip Flexor Stretch with Sidebend  - 1 x daily - 7 x weekly - 1 sets - 1 reps - 30 sec hold - Single Leg Bridge  - 1 x daily - 3 x weekly - 2 sets - 10 reps - Bear Plank from [employer]  - 1 x daily - 3 x weekly - 1 sets - 10 reps - Supine Transversus Abdominis Bracing with Leg Extension  - 1 x daily - 3 x weekly - 2 sets - 10 reps - Supine Dead Bug with Leg Extension  - 1 x daily - 3 x weekly - 2 sets - 10 reps - Modified Side Plank with Hip Abduction  - 1 x daily - 3 x weekly - 1 sets - 10 reps - Bird Dog with Knee Taps  - 1 x daily - 3 x weekly - 2 sets - 10 reps   ASSESSMENT:  CLINICAL IMPRESSION: Patient is a 37 y.o. female who was seen today for physical therapy  treatment for clitoral incontinence. Patient emptying her bladder 80% better. P She has not leaked with orgasm.  Pelvic floor strength is 4/5. She had good releases of the pelvic floor muscles and they feel softer than when started.  Patient reports no urinary leakage with daily activities. She is able to feel her abdominal muscles contract.  Patient will benefit from  skilled therapy to improve pelvic floor coordination and reduce tension to improve continence.   OBJECTIVE IMPAIRMENTS: decreased activity tolerance, decreased coordination, decreased strength, increased fascial restrictions, and pain.   ACTIVITY LIMITATIONS: continence  PARTICIPATION LIMITATIONS: interpersonal relationship  PERSONAL FACTORS: Time since onset of injury/illness/exacerbation are also affecting patient's functional outcome.   REHAB POTENTIAL: Excellent  CLINICAL DECISION MAKING: Stable/uncomplicated  EVALUATION COMPLEXITY: Low   GOALS: Goals reviewed with patient? Yes  SHORT TERM GOALS: Target date: 11/24  Patient independent with manual work to the perineal area to reduce restrictions.  Baseline: Goal status: Met 08/29/23  2.  Patient is  able to contract the lower abdominals equally with the upper.  Baseline:  Goal status: Met 08/15/23  3.  Patient reports less tenderness around the perineal body and clitoris.  Baseline:  Goal status: Met 08/29/23   LONG TERM GOALS: Target date: 02/06/24  Patient independent with advanced HEP for core and pelvic floor.  Baseline:  Goal status: ongoing 09/28/23  2.  Patient is able to have penile penetration vaginally without pain due to reduction of fascial restrictions and trigger points.  Baseline:  Goal status: ongoing 09/28/23  3.  Patient is able to climax without urinary leakage due to reduction of restrictions.  Baseline:  Goal status: Met 09/19/23  4.  Patient reports overall reduction of urinary leakage >/= 80% due to improved control of the pelvic floor.  Baseline:  Goal status: Met 09/19/23   PLAN:  PT FREQUENCY: 1x/week  PT DURATION: 6 months  PLANNED INTERVENTIONS: 97110-Therapeutic exercises, 97530- Therapeutic activity, 97112- Neuromuscular re-education, 97140- Manual therapy, 97035- Ultrasound, Patient/Family education, Dry Needling, Scar mobilization, Cryotherapy, Moist heat, and Biofeedback  PLAN FOR NEXT SESSION:  update HEP, check on pain with intercourse and emptying her bladder, if doing well then discharge  Eulis Foster, PT 09/28/23 12:10 PM  PHYSICAL THERAPY DISCHARGE SUMMARY  Visits from Start of Care: 6  Current functional level related to goals / functional outcomes: See above. Patient did not return for final assessment. Secretary spoke to patient on 11/27/23 and patient reports she is feeling better and wants to be discharged.    Remaining deficits: See above.    Education / Equipment: HEP   Patient agrees to discharge. Patient goals were met. Patient is being discharged due to being pleased with the current functional level. Thank you for the referral.   Eulis Foster, PT 11/27/23 4:26 PM

## 2023-10-03 DIAGNOSIS — F321 Major depressive disorder, single episode, moderate: Secondary | ICD-10-CM | POA: Diagnosis not present

## 2023-10-04 ENCOUNTER — Other Ambulatory Visit (HOSPITAL_COMMUNITY): Payer: BC Managed Care – PPO

## 2023-10-19 ENCOUNTER — Other Ambulatory Visit (HOSPITAL_COMMUNITY)
Admission: RE | Admit: 2023-10-19 | Discharge: 2023-10-19 | Disposition: A | Discharge status: Home / Self Care | Payer: Self-pay | Source: Ambulatory Visit | Attending: Oncology | Admitting: Oncology

## 2023-10-19 DIAGNOSIS — Z006 Encounter for examination for normal comparison and control in clinical research program: Secondary | ICD-10-CM

## 2023-11-04 LAB — GENECONNECT MOLECULAR SCREEN: Genetic Analysis Overall Interpretation: NEGATIVE

## 2024-02-29 ENCOUNTER — Ambulatory Visit: Admitting: Orthopedic Surgery

## 2024-02-29 DIAGNOSIS — S86002A Unspecified injury of left Achilles tendon, initial encounter: Secondary | ICD-10-CM | POA: Diagnosis not present

## 2024-03-01 ENCOUNTER — Encounter: Payer: Self-pay | Admitting: Orthopedic Surgery

## 2024-03-01 DIAGNOSIS — S86002A Unspecified injury of left Achilles tendon, initial encounter: Secondary | ICD-10-CM | POA: Diagnosis not present

## 2024-03-01 MED ORDER — LIDOCAINE HCL 1 % IJ SOLN
2.0000 mL | INTRAMUSCULAR | Status: AC | PRN
  Administered 2024-03-01: 2 mL

## 2024-03-01 MED ORDER — METHYLPREDNISOLONE ACETATE 40 MG/ML IJ SUSP
40.0000 mg | INTRAMUSCULAR | Status: AC | PRN
  Administered 2024-03-01: 40 mg via INTRA_ARTICULAR

## 2024-03-01 NOTE — Progress Notes (Signed)
 Office Visit Note   Patient: Heather Richards           Date of Birth: May 03, 1986           MRN: [id number] Visit Date: 02/29/2024              Requested by: Sinda Duel, [place] [address] [place],  [place] [address] PCP: Sinda Duel, [place]  Chief Complaint  Patient presents with   Left Achilles Tendon - Pain      HPI: Patient is a 38 year old woman who is seen for initial evaluation for Achilles tendinitis on the left.  Patient states she has had physical therapy for about 8 weeks.  She states this has included dry needling without relief.  Patient states that her Achilles is getting worse.  Assessment & Plan: Visit Diagnoses:  1. Injury of left Achilles tendon, initial encounter     Plan: Patient underwent a retro-Achilles bursal injection.  Continue with her exercises.  Patient may benefit from shockwave therapy with Dr. Vaughn Georges.  Follow-Up Instructions: Return if symptoms worsen or fail to improve.   Ortho Exam  Patient is alert, oriented, no adenopathy, well-dressed, normal affect, normal respiratory effort. Examination patient has a good pulse she has good ankle good subtalar motion with the knee extended she has dorsiflexion 20 degrees past neutral.  There is no palpable defect of the Achilles however patient does have a's small nodule in the mid substance of the Achilles.  Patient has not been on antibiotics.  Imaging: No results found. No images are attached to the encounter.  Labs: Lab Results  Component Value Date   LABORGA Multiple bacterial morphotypes present, none 01/09/2014   LABORGA predominant. Suggest appropriate recollection if 01/09/2014   LABORGA clinically indicated. 01/09/2014     Lab Results  Component Value Date   ALBUMIN 4.5 01/09/2014    No results found for: "MG" No results found for: "VD25OH"  No results found for: "PREALBUMIN"    Latest Ref Rng & Units 08/05/2021    9:40 AM 01/22/2016    5:10 AM 01/19/2016    6:22  PM  CBC EXTENDED  WBC 4.0 - 10.5 K/uL 8.3  15.7  15.7   RBC 3.87 - 5.11 MIL/uL 4.86  3.07  4.36   Hemoglobin 12.0 - 15.0 g/dL 62.1  8.9  30.8   HCT 65.7 - 46.0 % 43.3  26.4  37.4   Platelets 150 - 400 K/uL 315  163  198      There is no height or weight on file to calculate BMI.  Orders:  No orders of the defined types were placed in this encounter.  No orders of the defined types were placed in this encounter.    Procedures: Medium Joint Inj: L ankle on 03/01/2024 2:56 PM Indications: pain and diagnostic evaluation Details: 22 G 1.5 in needle, posterior approach Medications: 2 mL lidocaine  1 %; 40 mg methylPREDNISolone acetate 40 MG/ML Outcome: tolerated well, no immediate complications Procedure, treatment alternatives, risks and benefits explained, specific risks discussed. Consent was given by the patient. Immediately prior to procedure a time out was called to verify the correct patient, procedure, equipment, support staff and site/side marked as required. Patient was prepped and draped in the usual sterile fashion.      Clinical Data: No additional findings.  ROS:  All other systems negative, except as noted in the HPI. Review of Systems  Objective: Vital Signs: There were no vitals  taken for this visit.  Specialty Comments:  No specialty comments available.  PMFS History: There are no active problems to display for this patient.  Past Medical History:  Diagnosis Date   Allergy    Anemia 2017   ASCUS (atypical squamous cells of undetermined significance) on Pap smear 12/2011   hpv not detected   History of endoscopy 01/2021   Hx of migraines    Hypothyroidism 2017   Normal vaginal delivery 01/21/2016   Rh negative status during pregnancy 01/19/2016   Rubella non-immune status, antepartum 01/19/2016   Thyroid  disease    Vaginal Pap smear, abnormal     Family History  Problem Relation Age of Onset   Cancer Maternal Aunt        colon   Cancer Maternal  Uncle        brain   Cancer Paternal Uncle        lung   Heart disease Maternal Grandfather    Healthy Father    Healthy Mother     Past Surgical History:  Procedure Laterality Date   BREAST REDUCTION SURGERY Bilateral 08/09/2021   Procedure: MAMMARY REDUCTION  (BREAST);  Surgeon: Alger Infield, MD;  Location: [facility] OR;  Service: Plastics;  Laterality: Bilateral;   EYE SURGERY Bilateral 2013   lasik   WISDOM TOOTH EXTRACTION     Social History   Occupational History   Not on file  Tobacco Use   Smoking status: Never   Smokeless tobacco: Never  Vaping Use   Vaping status: Never Used  Substance and Sexual Activity   Alcohol use: Yes    Alcohol/week: 1.0 standard drink of alcohol    Types: 1 Glasses of wine per week    Comment: occasional   Drug use: No   Sexual activity: Yes    Birth control/protection: I.U.D.

## 2024-06-19 ENCOUNTER — Encounter: Payer: Self-pay | Admitting: Family Medicine

## 2024-06-19 ENCOUNTER — Ambulatory Visit (INDEPENDENT_AMBULATORY_CARE_PROVIDER_SITE_OTHER): Payer: Self-pay | Admitting: Family Medicine

## 2024-06-19 DIAGNOSIS — M7662 Achilles tendinitis, left leg: Secondary | ICD-10-CM

## 2024-06-19 NOTE — Progress Notes (Signed)
 PCP: Alben Therisa MATSU, PA  Subjective:   HPI: Patient is a 38 y.o. female here for left achilles tendinopathy.  Patient has several year history of left achilles tendinopathy. She initially did physical therapy and dry needling helped tremendously as this completely improved. This flared back up more recently and unfortunately rehab/dry needling didn't seem to have the same effect. Seen by Dr. Duda 5/8, had injection with benefit for about 2 months. Sent here for consideration of shockwave therapy.  Past Medical History:  Diagnosis Date   Allergy    Anemia 2017   ASCUS (atypical squamous cells of undetermined significance) on Pap smear 12/2011   hpv not detected   History of endoscopy 01/2021   Hx of migraines    Hypothyroidism 2017   Normal vaginal delivery 01/21/2016   Rh negative status during pregnancy 01/19/2016   Rubella non-immune status, antepartum 01/19/2016   Thyroid  disease    Vaginal Pap smear, abnormal     Current Outpatient Medications on File Prior to Visit  Medication Sig Dispense Refill   acetaminophen  (TYLENOL ) 500 MG tablet Take 1,000 mg by mouth every 6 (six) hours as needed for moderate pain or headache.     cholecalciferol (VITAMIN D3) 25 MCG (1000 UNIT) tablet Take 1,000 Units by mouth daily.     FERROUS BISGLYCINATE CHELATE PO Take 36-72 mg by mouth See admin instructions. Alternate taking 36 mg one day and 72 mg the next     fluticasone (FLONASE) 50 MCG/ACT nasal spray Place 1 spray into both nostrils daily as needed for allergies or rhinitis.     loratadine (CLARITIN) 10 MG tablet Take 10 mg by mouth daily as needed for allergies.     MAGNESIUM GLUCONATE PO Take 200 mg by mouth every evening.     Melatonin-Theanine (MELATONIN FORTE/L-THEANINE PO) Take 1 tablet by mouth at bedtime as needed (sleep).     naproxen sodium (ALEVE) 220 MG tablet Take 440 mg by mouth daily as needed (pain).     Probiotic Product (PROBIOTIC PO) Take 1 capsule by mouth daily.      thyroid  (ARMOUR) 60 MG tablet Take 60 mg by mouth daily.     Vilazodone HCl (VIIBRYD) 40 MG TABS Take by mouth daily.     No current facility-administered medications on file prior to visit.    Past Surgical History:  Procedure Laterality Date   BREAST REDUCTION SURGERY Bilateral 08/09/2021   Procedure: MAMMARY REDUCTION  (BREAST);  Surgeon: Arelia Filippo, MD;  Location: [facility] OR;  Service: Plastics;  Laterality: Bilateral;   EYE SURGERY Bilateral 2013   lasik   WISDOM TOOTH EXTRACTION      Allergies  Allergen Reactions   Omeprazole Nausea And Vomiting   Other Hives and Rash    Flounder     There were no vitals taken for this visit.      No data to display              No data to display              Objective:  Physical Exam:  Gen: NAD, comfortable in exam room  Left ankle: Mild swelling achilles about 3cm proximal to insertion on calcaneus. Mild tenderness in this area.   Assessment & Plan:  1. Left achilles tendinopathy - discussed risks/benefits and proceeded with shockwave therapy - first treatment given today as below.  Expect 4-6 total treatments.  Follow up in 1 week.  Procedure: ECSWT Indications:  left achilles tendinopathy  Procedure Details Consent: Risks of procedure as well as the alternatives and risks of each were explained to the patient.  Written consent for procedure obtained. Time Out: Verified patient identification, verified procedure, site was marked, verified correct patient position, medications/allergies/relevent history reviewed.  The area was cleaned with alcohol swab.     The left achilles tendon was targeted for Extracorporeal shockwave therapy.    Preset: achillodynia Power Level: 80 Frequency: 15 Impulse/cycles: 2000 Head size: large   Patient tolerated procedure well without immediate complications

## 2024-06-25 ENCOUNTER — Ambulatory Visit (INDEPENDENT_AMBULATORY_CARE_PROVIDER_SITE_OTHER): Payer: Self-pay | Admitting: Family Medicine

## 2024-06-25 ENCOUNTER — Encounter: Payer: Self-pay | Admitting: Family Medicine

## 2024-06-25 DIAGNOSIS — M7662 Achilles tendinitis, left leg: Secondary | ICD-10-CM

## 2024-06-25 NOTE — Patient Instructions (Signed)

## 2024-06-25 NOTE — Progress Notes (Signed)
 FOLLOW-UP VISIT:  Shockwave Therapy Procedure   NAME:  Heather Richards DOB:  05-17-1986 MR#: [id number] DATE OF VISIT:  06/25/2024  Encounter:   Heather Richards comes in for a follow up evaluation and 2nd Radial Extracorporeal Shockwave therapy (ESWT) treatment to the  Lt achilles.  Heather Richards reports felt a little more uncomfortable initially after last treatment, but now feeling somewhat better over the last 1-2 days  ESWT Procedure Note: Treatment #2  Diagnosis: Lt achilles tendinopathy  Procedure Details  Consent for the procedure was obtained. Time out was performed. The anatomical landmarks and target areas for the procedure were identified.   PROCEDURE: ESWT was discussed with the patient in detail.  The risk and benefits were explained.  The point of maximal tenderness was identified and the oscillator probe was applied with moderate pressure. Treatment adjusted as mediated by patient feedback/pain.  Lt achilles was targeted for ESWT  Preset: achillodynia  Power level:  100 MJ Frequency: 15 Hz Impulses: 2000 Head size: large   Complications:  None; patient tolerated the procedure well.  ASSESSMENT/PLAN: 1. Left achilles tendinopathy   Plan:   ESWT completed today as noted above Return in 1 week for ESWT procedure #3 Procedure aftercare reviewed Recommended avoiding strenuous activities and using OTC analgesia prn.

## 2024-07-04 ENCOUNTER — Ambulatory Visit (INDEPENDENT_AMBULATORY_CARE_PROVIDER_SITE_OTHER): Payer: Self-pay | Admitting: Sports Medicine

## 2024-07-04 ENCOUNTER — Encounter: Payer: Self-pay | Admitting: Sports Medicine

## 2024-07-04 DIAGNOSIS — M7662 Achilles tendinitis, left leg: Secondary | ICD-10-CM

## 2024-07-04 NOTE — Progress Notes (Signed)
 FOLLOW-UP VISIT:  Shockwave Therapy Procedure   NAME:  KEATON STIREWALT DOB:  Apr 03, 1986 MR#: [id number] DATE OF VISIT:  07/04/2024  Encounter:   [name] comes in for a follow up evaluation and 3rd Radial Extracorporeal Shockwave therapy (ESWT) treatment to the left achilles.  Alexsandra reports mild interval improvement in symptoms.  She has remained active with /home exercises PT.    ESWT Procedure Note: Treatment #3  Diagnosis: Left Achilles tendinopathy  Procedure Details  Consent for the procedure was obtained. Time out was performed. The anatomical landmarks and target areas for the procedure were identified.   PROCEDURE: ESWT was discussed with the patient in detail.  The risk and benefits were explained.  The point of maximal tenderness was identified and the oscillator probe was applied with moderate pressure. Treatment adjusted as mediated by patient feedback/pain.  Left Achilles was targeted for ESWT  Preset: Achillodynia Power level: 110 MJ Frequency: 16 Hz Impulses: 2000 Head size: large   Complications:  None; patient tolerated the procedure well.  1. Left Achilles tendinopathy  Plan:   ESWT completed today as noted above Return in 1 week for ESWT procedure Procedure aftercare reviewed Recommended avoiding strenuous activities and using OTC analgesia prn.

## 2024-07-09 ENCOUNTER — Encounter: Payer: Self-pay | Admitting: Family Medicine

## 2024-07-09 ENCOUNTER — Ambulatory Visit (INDEPENDENT_AMBULATORY_CARE_PROVIDER_SITE_OTHER): Payer: Self-pay | Admitting: Family Medicine

## 2024-07-09 DIAGNOSIS — M7662 Achilles tendinitis, left leg: Secondary | ICD-10-CM

## 2024-07-09 NOTE — Progress Notes (Signed)
 FOLLOW-UP VISIT:  Shockwave Therapy Procedure   NAME:  Heather Richards DOB:  09-22-1986 MR#: [id number] DATE OF VISIT:  07/09/2024  Encounter:   Heather Richards comes in for a follow up evaluation and 4th Radial Extracorporeal Shockwave therapy (ESWT) treatment to the  left achilles.  Heather Richards reports some interval change in symptoms - notes having more days without pain, but unsure how much it is helping overall    ESWT Procedure Note: Treatment #4  Diagnosis: left achilles tendinopathy  Procedure Details  Consent for the procedure was obtained. Time out was performed. The anatomical landmarks and target areas for the procedure were identified.   PROCEDURE: ESWT was discussed with the patient in detail.  The risk and benefits were explained.  The point of maximal tenderness was identified and the oscillator probe was applied with moderate pressure. Treatment adjusted as mediated by patient feedback/pain.  Lt achilles was targeted for ESWT  Preset: achillodynia  Power level: 120 MJ Frequency: 16 Hz Impulses: 3000 Head size: large   Complications:  None; patient tolerated the procedure well.  ASSESSMENT/PLAN: 1. Left achilles tendinopathy   Plan:   ESWT completed today as noted above Return in 1 week for ESWT procedure #5 Procedure aftercare reviewed Recommended avoiding strenuous activities and using OTC analgesia prn.

## 2024-07-09 NOTE — Patient Instructions (Signed)

## 2024-07-18 ENCOUNTER — Ambulatory Visit (INDEPENDENT_AMBULATORY_CARE_PROVIDER_SITE_OTHER): Payer: Self-pay | Admitting: Sports Medicine

## 2024-07-18 DIAGNOSIS — M7662 Achilles tendinitis, left leg: Secondary | ICD-10-CM | POA: Insufficient documentation

## 2024-07-18 NOTE — Progress Notes (Signed)
 Left Achilles Tendon She has had chronic pain for ~ 2 years.  Still feels she is making incremental improvements in pain and is pain free often with walking.  Pain with going up steps.  Pain localizes to the tendon.  ESWT # 5KB Head size - moderate Power - 120 mJ Freq. 16 Impulses 3000  Patient tolerated the treatment well.  We did extend up to the superior portions of tendon but most focus 4 cms above insertion.  We avoided the calcaneus as this causes some pain.  Heather Haddock, MD

## 2024-07-18 NOTE — Assessment & Plan Note (Signed)
 Gradual improvement with ESWT Plan at least one more session but man need additional ones. Repeat in 1 week

## 2024-07-25 ENCOUNTER — Ambulatory Visit (INDEPENDENT_AMBULATORY_CARE_PROVIDER_SITE_OTHER): Payer: Self-pay | Admitting: Family Medicine

## 2024-07-25 ENCOUNTER — Encounter: Payer: Self-pay | Admitting: Family Medicine

## 2024-07-25 DIAGNOSIS — M7662 Achilles tendinitis, left leg: Secondary | ICD-10-CM

## 2024-07-25 NOTE — Patient Instructions (Signed)

## 2024-07-25 NOTE — Progress Notes (Signed)
 FOLLOW-UP VISIT:  Shockwave Therapy Procedure   NAME:  Heather Richards DOB:  09-Aug-1986 MR#: [id number] DATE OF VISIT:  07/25/2024  Encounter:   [name] comes in for a follow up evaluation and 6th Radial Extracorporeal Shockwave therapy (ESWT) treatment to the  left achilles.  Paw reports increased pain x 5 days after last treatment, but has been feeling good since   ESWT Procedure Note: Treatment #6  Diagnosis: Lt achilles tendinopathy  Procedure Details  Consent for the procedure was obtained. Time out was performed. The anatomical landmarks and target areas for the procedure were identified.   PROCEDURE: ESWT was discussed with the patient in detail.  The risk and benefits were explained.  The point of maximal tenderness was identified and the oscillator probe was applied with moderate pressure. Treatment adjusted as mediated by patient feedback/pain.  Left achilles was targeted for ESWT  Preset: achillodynia  Power level: 120 MJ Frequency: 16 Hz Impulses: 3000 Head size: medium   Complications:  None; patient tolerated the procedure well.  ASSESSMENT/PLAN: 1. Left Achilles tendinitis  Plan:   ESWT completed today as noted above Return in 2-3 week to reassess and determine if further ESWT procedure is necessary Procedure aftercare reviewed Recommended avoiding strenuous activities and using OTC analgesia prn.

## 2024-08-15 ENCOUNTER — Ambulatory Visit (INDEPENDENT_AMBULATORY_CARE_PROVIDER_SITE_OTHER): Payer: Self-pay | Admitting: Family Medicine

## 2024-08-15 ENCOUNTER — Encounter: Payer: Self-pay | Admitting: Family Medicine

## 2024-08-15 DIAGNOSIS — M7662 Achilles tendinitis, left leg: Secondary | ICD-10-CM

## 2024-08-15 NOTE — Progress Notes (Signed)
 FOLLOW-UP VISIT:  Shockwave Therapy Procedure   NAME:  Heather Richards DOB:  December 18, 1985 MR#: [id number] DATE OF VISIT:  08/15/2024  Encounter:   Mahalia comes in for a follow up evaluation and 7th Radial Extracorporeal Shockwave therapy (ESWT) treatment to the left Achilles.  Tanja reports after her last treatment 07/25/2024 had some discomfort for several days, but then last week was starting to feel better.  Was able to do a lot of walking around at the [employer]  fair without much discomfort during or after She overall is still feeling greatly improved, still some discomfort, would like repeat treatment today   ESWT Procedure Note: Treatment # 7  Diagnosis: Achilles tendinopathy left  Procedure Details  Consent for the procedure was obtained. Time out was performed. The anatomical landmarks and target areas for the procedure were identified.   PROCEDURE: ESWT was discussed with the patient in detail.  The risk and benefits were explained.  The point of maximal tenderness was identified and the oscillator probe was applied with moderate pressure. Treatment adjusted as mediated by patient feedback/pain.  Left Achilles was targeted for ESWT  Preset: achillodynia  Power level: 120 MJ Frequency: 16 hz Impulses: 3000 Head size: Medium   Complications:  None; patient tolerated the procedure well.  ASSESSMENT/PLAN: 1. Left Achilles tendinopathy  Plan:   ESWT completed today as noted above Return in 1-2 week for ESWT procedure #8 if needed Procedure aftercare reviewed Recommended avoiding strenuous activities and using OTC analgesia prn.

## 2024-08-15 NOTE — Patient Instructions (Signed)

## 2024-08-20 ENCOUNTER — Encounter: Payer: Self-pay | Admitting: Family Medicine

## 2024-08-20 ENCOUNTER — Ambulatory Visit (INDEPENDENT_AMBULATORY_CARE_PROVIDER_SITE_OTHER): Payer: Self-pay | Admitting: Family Medicine

## 2024-08-20 DIAGNOSIS — M7662 Achilles tendinitis, left leg: Secondary | ICD-10-CM

## 2024-08-20 NOTE — Patient Instructions (Signed)

## 2024-08-20 NOTE — Progress Notes (Signed)
 FOLLOW-UP VISIT:  Shockwave Therapy Procedure   NAME:  Heather Richards DOB:  [birth year] MR#: [id number] DATE OF VISIT:  08/20/2024  Encounter:   Heather Richards comes in for a follow up evaluation and 8th Radial Extracorporeal Shockwave therapy (ESWT) treatment to the  left achilles.  Heather Richards reports little interval change in symptoms since last visit Wondering if she can start using treadmill at home when working to walk Wondering best shoes to wear   ESWT Procedure Note: Treatment #8  Diagnosis: Left achilles tendinopathy  Procedure Details  Consent for the procedure was obtained. Time out was performed. The anatomical landmarks and target areas for the procedure were identified.   PROCEDURE: ESWT was discussed with the patient in detail.  The risk and benefits were explained.  The point of maximal tenderness was identified and the oscillator probe was applied with moderate pressure. Treatment adjusted as mediated by patient feedback/pain.  Lt achilles was targeted for ESWT  Preset: acchylidynia Power level: 120 MJ Frequency: 16 Hz Impulses: 3000 Head size: medium   Complications:  None; patient tolerated the procedure well.  1. Left Achilles tendinitis  Plan:   ESWT completed today as noted above Return in 2-3 weeks to reassess and determine if further ESWT procedure is indicated Procedure aftercare reviewed Recommended avoiding strenuous activities and using OTC analgesia prn. Discussed footwear options.  Was given information for [employer], as well as discount coupon.  She will reach out to them for new shoes Is okay for her to start using treadmill for walking while she is working at home.  Advised should keep it flat and avoid inclines at this time.  Increased loading may help with improving her symptoms

## 2024-08-26 ENCOUNTER — Encounter: Payer: Self-pay | Admitting: Radiology

## 2025-01-14 ENCOUNTER — Ambulatory Visit: Admitting: Physician Assistant
